# Patient Record
Sex: Male | Born: 1955
Health system: Southern US, Community
[De-identification: ages and names within clinical notes are randomized; demographics above are authoritative.]

## PROBLEM LIST (undated history)

## (undated) DIAGNOSIS — K635 Polyp of colon: Secondary | ICD-10-CM

## (undated) DIAGNOSIS — G4730 Sleep apnea, unspecified: Secondary | ICD-10-CM

## (undated) DIAGNOSIS — L678 Other hair color and hair shaft abnormalities: Secondary | ICD-10-CM

## (undated) DIAGNOSIS — L738 Other specified follicular disorders: Secondary | ICD-10-CM

## (undated) DIAGNOSIS — M1990 Unspecified osteoarthritis, unspecified site: Secondary | ICD-10-CM

## (undated) DIAGNOSIS — K219 Gastro-esophageal reflux disease without esophagitis: Secondary | ICD-10-CM

## (undated) DIAGNOSIS — J449 Chronic obstructive pulmonary disease, unspecified: Secondary | ICD-10-CM

## (undated) DIAGNOSIS — R42 Dizziness and giddiness: Secondary | ICD-10-CM

## (undated) DIAGNOSIS — F329 Major depressive disorder, single episode, unspecified: Secondary | ICD-10-CM

## (undated) DIAGNOSIS — E785 Hyperlipidemia, unspecified: Secondary | ICD-10-CM

## (undated) DIAGNOSIS — S22009A Unspecified fracture of unspecified thoracic vertebra, initial encounter for closed fracture: Secondary | ICD-10-CM

## (undated) DIAGNOSIS — F32A Depression, unspecified: Secondary | ICD-10-CM

## (undated) DIAGNOSIS — J439 Emphysema, unspecified: Secondary | ICD-10-CM

## (undated) DIAGNOSIS — K259 Gastric ulcer, unspecified as acute or chronic, without hemorrhage or perforation: Secondary | ICD-10-CM

## (undated) DIAGNOSIS — F419 Anxiety disorder, unspecified: Secondary | ICD-10-CM

## (undated) HISTORY — DX: Hyperlipidemia, unspecified: E78.5

## (undated) HISTORY — DX: Anxiety disorder, unspecified: F41.9

## (undated) HISTORY — DX: Unspecified fracture of unspecified thoracic vertebra, initial encounter for closed fracture: S22.009A

## (undated) HISTORY — DX: Sleep apnea, unspecified: G47.30

## (undated) HISTORY — DX: Depression, unspecified: F32.A

## (undated) HISTORY — PX: APPENDECTOMY: SHX54

## (undated) HISTORY — PX: CERVICAL FUSION: SHX112

## (undated) HISTORY — DX: Unspecified osteoarthritis, unspecified site: M19.90

## (undated) HISTORY — DX: Emphysema, unspecified: J43.9

## (undated) HISTORY — DX: Other specified follicular disorders: L73.8

## (undated) HISTORY — DX: Gastric ulcer, unspecified as acute or chronic, without hemorrhage or perforation: K25.9

## (undated) HISTORY — DX: Other hair color and hair shaft abnormalities: L67.8

## (undated) HISTORY — DX: Chronic obstructive pulmonary disease, unspecified: J44.9

## (undated) HISTORY — DX: Major depressive disorder, single episode, unspecified: F32.9

## (undated) HISTORY — DX: Gastro-esophageal reflux disease without esophagitis: K21.9

## (undated) HISTORY — DX: Dizziness and giddiness: R42

## (undated) HISTORY — DX: Polyp of colon: K63.5

## (undated) HISTORY — PX: INGUINAL HERNIA REPAIR: SUR1180

---

## 2010-02-06 ENCOUNTER — Emergency Department (HOSPITAL_BASED_OUTPATIENT_CLINIC_OR_DEPARTMENT_OTHER)
Admission: EM | Admit: 2010-02-06 | Discharge: 2010-02-06 | Payer: Self-pay | Source: Home / Self Care | Admitting: Emergency Medicine

## 2010-02-07 ENCOUNTER — Inpatient Hospital Stay (HOSPITAL_COMMUNITY)
Admission: EM | Admit: 2010-02-07 | Discharge: 2010-02-10 | Payer: Self-pay | Attending: Neurosurgery | Admitting: Neurosurgery

## 2011-10-05 ENCOUNTER — Encounter (HOSPITAL_BASED_OUTPATIENT_CLINIC_OR_DEPARTMENT_OTHER): Payer: Self-pay | Admitting: *Deleted

## 2011-10-05 ENCOUNTER — Emergency Department (HOSPITAL_BASED_OUTPATIENT_CLINIC_OR_DEPARTMENT_OTHER)
Admission: EM | Admit: 2011-10-05 | Discharge: 2011-10-05 | Disposition: A | Discharge status: Home / Self Care | Payer: Self-pay | Attending: Emergency Medicine | Admitting: Emergency Medicine

## 2011-10-05 DIAGNOSIS — K219 Gastro-esophageal reflux disease without esophagitis: Secondary | ICD-10-CM | POA: Insufficient documentation

## 2011-10-05 DIAGNOSIS — F17200 Nicotine dependence, unspecified, uncomplicated: Secondary | ICD-10-CM | POA: Insufficient documentation

## 2011-10-05 DIAGNOSIS — K4090 Unilateral inguinal hernia, without obstruction or gangrene, not specified as recurrent: Secondary | ICD-10-CM | POA: Insufficient documentation

## 2011-10-05 HISTORY — DX: Unspecified fracture of unspecified thoracic vertebra, initial encounter for closed fracture: S22.009A

## 2011-10-05 MED ORDER — POLYETHYLENE GLYCOL 3350 17 GM/SCOOP PO POWD: 17.0000 g | Freq: Every day | ORAL | Status: AC

## 2011-10-05 NOTE — ED Notes (Signed)
Patient states he has had left groin pain and swelling for the last 3 days.  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

## 2011-10-05 NOTE — ED Provider Notes (Signed)
History     CSN: [id number]  Arrival date & time 10/05/11  1051   First MD Initiated Contact with Patient 10/05/11 1115      Chief Complaint  Patient presents with  . Groin Swelling    (Consider location/radiation/quality/duration/timing/severity/associated sxs/prior treatment) HPI Patient is a 56 year old male presents today complaining of swelling in the left groin area over the past 3 days. 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 He presents today as he's had continuing pain with this labeled in this area. Patient has no prior history of hernia. He denies any testicular pain or swelling. Patient denies any dysuria or hematuria. Patient denies abdominal pain. He notes he's had recent constipation his last bowel movement was this morning. Patient does note worsening of his pain with straining with bowel movements. He denies any blood in his stools or dark her stools. Patient denies nausea or vomiting as well. There's no radiation the patient's pain. He rates this as a 6/10. This is better with sitting and worse with standing. There no other associated or modifying factors. Past Medical History  Diagnosis Date  . Reflux   . Thoracic spine fracture     Past Surgical History  Procedure Date  . Appendectomy     History reviewed. No pertinent family history.  History  Substance Use Topics  . Smoking status: Current Everyday Smoker    Types: Cigarettes  . Smokeless tobacco: Not on file  . Alcohol Use: No      Review of Systems  Constitutional: Negative.   HENT: Negative.   Eyes: Negative.   Respiratory: Negative.   Cardiovascular: Negative.   Gastrointestinal: Positive for constipation.  Genitourinary:       Left groin pain  Musculoskeletal: Negative.   Skin: Negative.   Neurological: Negative.   Hematological: Negative.   Psychiatric/Behavioral: Negative.   All other systems reviewed and are negative.    Allergies  Niacin  and related  Home Medications   Current Outpatient Rx  Name Route Sig Dispense Refill  . ACETAMINOPHEN 500 MG PO TABS Oral Take 500 mg by mouth every 6 (six) hours as needed.    [name] OMEPRAZOLE 20 MG PO CPDR Oral Take 20 mg by mouth daily.    [name] POLYETHYLENE GLYCOL 3350 PO POWD Oral Take 17 g by mouth daily. 255 g 0    BP 124/80  Pulse 72  Temp 98.5 F (36.9 C) (Oral)  Resp 20  Ht 5\' 4"  (1.626 m)  Wt 143 lb (64.864 kg)  BMI 24.55 kg/m2  SpO2 100%  Physical Exam  Nursing note and vitals reviewed. GEN: Well-developed, well-nourished male in no distress HEENT: Atraumatic, normocephalic.  EYES: PERRLA BL, no scleral icterus. NECK: Trachea midline, no meningismus CV: regular rate and rhythm. No murmurs, rubs, or gallops PULM: No respiratory distress.  No crackles, wheezes, or rales. GI: soft, non-tender. No guarding, rebound, or tenderness. + bowel sounds  GU: Circumcised male with no inguinal lymphadenopathy. Patient noted to have tenderness on left inguinal hernia exam with small hernia appreciated. No hernia appreciated on the right. No epididymal or testicular tenderness. No testicular masses noted. No discharge or other skin lesions appreciated. Neuro: cranial nerves grossly 2-12 intact, no abnormalities of strength or sensation, A and O x 3 MSK: Patient moves all 4 extremities symmetrically, no deformity, edema, or injury noted Skin: No rashes petechiae, purpura, or jaundice Psych: no abnormality of mood   ED Course  Procedures (including critical  care time)  Labs Reviewed - No data to display No results found.   1. Inguinal hernia       MDM  Patient was evaluated by myself. Based on evaluation patient had small left-sided inguinal hernia. History was consistent with symptoms from this. Patient denied any dysuria. He endorsed occasional constipation with worsening of his pain with significant straining with bowel movements. Patient given prescription for MiraLAX.  Patient has home pain medications for his back but did not take these on regular basis. He was instructed to use ice pack as well as supine position help alleviate pain from this in the future. Patient did not have incarcerated hernia today. Patient was referred to Gen. surgery. He was told that if he had increasing swelling in this area but did not resolve with ice and home pain medications that he needed to return immediately for evaluation. Patient was comfortable with plan and was discharged in good condition.        Cyndra Numbers, MD 10/05/11 [phone]

## 2011-10-19 ENCOUNTER — Ambulatory Visit (INDEPENDENT_AMBULATORY_CARE_PROVIDER_SITE_OTHER): Payer: Self-pay | Admitting: General Surgery

## 2011-10-19 ENCOUNTER — Encounter (INDEPENDENT_AMBULATORY_CARE_PROVIDER_SITE_OTHER): Payer: Self-pay | Admitting: General Surgery

## 2011-10-19 VITALS — BP 118/64 | HR 72 | Temp 98.0°F | Resp 16 | Ht 64.0 in | Wt 154.4 lb

## 2011-10-19 DIAGNOSIS — K4090 Unilateral inguinal hernia, without obstruction or gangrene, not specified as recurrent: Secondary | ICD-10-CM

## 2011-10-19 MED ORDER — TAMSULOSIN HCL 0.4 MG PO CAPS: 0.4000 mg | ORAL_CAPSULE | Freq: Every day | ORAL | Status: DC

## 2011-10-19 NOTE — Patient Instructions (Signed)
Pick up your prescription for Flomax and start taking 2 days before surgery

## 2011-10-19 NOTE — Progress Notes (Signed)
Patient ID: Jared Bryan, male   DOB: Aug 07, 1955, 56 y.o.   MRN: [id number]  Chief Complaint  Patient presents with  . Pre-op Exam    eval [facility]    HPI Jared Bryan is a 55 y.o. male.   HPI 56 year old Caucasian male referred by Dr. Alto Denver for evaluation of a left inguinal hernia. The patient states that he awoke about 2 weeks ago on a Saturday morning with pain in his left groin. He also noted some swelling at that time. Throughout the day he developed burning and stinging in his groin area radiating into his inner thigh. He describes it as a shooting pain. He also felt a knot. He went to the emergency room where he was found to have a reducible inguinal hernia. He denies any fever, chills, nausea, vomiting, diarrhea or constipation. He states the swelling has gone down significantly since he was in the emergency room. 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  Past Medical History  Diagnosis Date  . Reflux   . Thoracic spine fracture   . GERD (gastroesophageal reflux disease)   . Hyperlipidemia     Past Surgical History  Procedure Date  . Appendectomy     Family History  Problem Relation Age of Onset  . Heart disease Father   . Cancer Paternal Grandfather     lung    Social History History  Substance Use Topics  . Smoking status: Current Every Day Smoker -- 2.0 packs/day    Types: Cigarettes  . Smokeless tobacco: Former Neurosurgeon  . Alcohol Use: No    Allergies  Allergen Reactions  . Niacin And Related Rash    Current Outpatient Prescriptions  Medication Sig Dispense Refill  . acetaminophen (TYLENOL) 500 MG tablet Take 500 mg by mouth every 6 (six) hours as needed.      [name] HYDROcodone-acetaminophen (NORCO/VICODIN) 5-325 MG per tablet Take 1 tablet by mouth as needed. Pt states taking 1/2 tablet as needed.   Has had rx since Nov. 2012      . omeprazole (PRILOSEC) 20 MG capsule Take 20 mg by mouth daily.      . Tamsulosin  HCl (FLOMAX) 0.4 MG CAPS Take 1 capsule (0.4 mg total) by mouth daily after supper.  7 capsule  0    Review of Systems Review of Systems  Constitutional: Negative for fever, chills, appetite change and unexpected weight change.  HENT: Negative for congestion and trouble swallowing.   Eyes: Negative for visual disturbance.  Respiratory: Positive for cough. Negative for chest tightness and shortness of breath.   Cardiovascular: Negative for chest pain and leg swelling.       No PND, no orthopnea, no DOE  Gastrointestinal: Negative for diarrhea and constipation.       See HPI  Genitourinary: Positive for difficulty urinating. Negative for dysuria and hematuria.       +nocturia, weak stream  Musculoskeletal: Negative.   Skin: Negative for rash.  Neurological: Positive for headaches. Negative for seizures and speech difficulty.       Denies TIA, amaurosis fugax; decreased sensation in legs secondary to back fracture  Hematological: Does not bruise/bleed easily.  Psychiatric/Behavioral: Negative for behavioral problems and confusion.    Blood pressure 118/64, pulse 72, temperature 98 F (36.7 C), temperature source Temporal, resp. rate 16, height 5\' 4"  (1.626 m), weight 154 lb 6.4 oz (70.035 kg).  Physical Exam Physical Exam  Vitals reviewed.  Constitutional: He is oriented to person, place, and time. He appears well-developed and well-nourished. No distress.       Walks with limp; obese  HENT:  Head: Normocephalic and atraumatic.  Right Ear: External ear normal.  Left Ear: External ear normal.  Eyes: Conjunctivae normal are normal. No scleral icterus.  Neck: Normal range of motion. Neck supple. No tracheal deviation present. No thyromegaly present.  Cardiovascular: Normal rate, regular rhythm and normal heart sounds.   Pulmonary/Chest: Effort normal and breath sounds normal. No respiratory distress. He has no wheezes.  Abdominal: Soft. Bowel sounds are normal. He exhibits no  distension. There is no tenderness. A hernia is present. Hernia confirmed positive in the left inguinal area. Hernia confirmed negative in the right inguinal area.  Genitourinary: Testes normal and penis normal.     Musculoskeletal: He exhibits no edema and no tenderness.  Neurological: He is alert and oriented to person, place, and time.  Skin: Skin is warm and dry. He is not diaphoretic.  Psychiatric: He has a normal mood and affect. His behavior is normal. Judgment and thought content normal.    Data Reviewed [facility] ED note from 10/05/11  Assessment    Left inguinal hernia    Plan    We discussed the etiology of inguinal hernias. We discussed the signs & symptoms of incarceration & strangulation.  We discussed non-operative and operative management.  The patient has elected to proceed with OPEN REPAIR OF LEFT INGUINAL HERNIA WITH MESH   I described the procedure in detail.  The patient was given educational material. We discussed the risks and benefits including but not limited to bleeding, infection, chronic inguinal pain, nerve entrapment, hernia recurrence, mesh complications, hematoma formation, urinary retention, injury to the testicles, numbness in the groin, blood clots, injury to the surrounding structures, and anesthesia risk. We also discussed the typical post operative recovery course, including no heavy lifting for 4-6 weeks. I explained that the likelihood of improvement of their symptoms is good. I explained to the patient that he is at higher risk for infection and recurrence given his smoking. We will place him on perioperative Flomax to decrease his risk of urinary retention.   Mary Sella. Andrey Campanile, MD, FACS General, Bariatric, & Minimally Invasive Surgery [facility], [place]         [facility] M 10/19/2011, 3:26 PM

## 2011-10-26 ENCOUNTER — Ambulatory Visit
Admission: RE | Admit: 2011-10-26 | Discharge: 2011-10-26 | Disposition: A | Discharge status: Home / Self Care | Payer: No Typology Code available for payment source | Source: Ambulatory Visit | Attending: General Surgery | Admitting: General Surgery

## 2011-10-26 ENCOUNTER — Other Ambulatory Visit (INDEPENDENT_AMBULATORY_CARE_PROVIDER_SITE_OTHER): Payer: Self-pay | Admitting: General Surgery

## 2011-10-26 DIAGNOSIS — Z01811 Encounter for preprocedural respiratory examination: Secondary | ICD-10-CM

## 2011-10-28 DIAGNOSIS — K4090 Unilateral inguinal hernia, without obstruction or gangrene, not specified as recurrent: Secondary | ICD-10-CM

## 2011-11-04 ENCOUNTER — Other Ambulatory Visit (INDEPENDENT_AMBULATORY_CARE_PROVIDER_SITE_OTHER): Payer: Self-pay | Admitting: General Surgery

## 2011-11-04 NOTE — Telephone Encounter (Signed)
Hydrocodone 5/325 #30 with no refill per protocol called to [facility] pharmacy.

## 2011-11-09 ENCOUNTER — Ambulatory Visit (INDEPENDENT_AMBULATORY_CARE_PROVIDER_SITE_OTHER): Payer: Self-pay | Admitting: General Surgery

## 2011-11-09 ENCOUNTER — Encounter (INDEPENDENT_AMBULATORY_CARE_PROVIDER_SITE_OTHER): Payer: Self-pay | Admitting: General Surgery

## 2011-11-09 VITALS — BP 142/80 | HR 58 | Temp 98.0°F | Resp 18 | Ht 64.0 in | Wt 156.4 lb

## 2011-11-09 DIAGNOSIS — Z09 Encounter for follow-up examination after completed treatment for conditions other than malignant neoplasm: Secondary | ICD-10-CM

## 2011-11-09 MED ORDER — TAMSULOSIN HCL 0.4 MG PO CAPS
0.4000 mg | ORAL_CAPSULE | Freq: Every day | ORAL | Status: DC
Start: 2011-11-09 — End: 2012-10-09

## 2011-11-09 NOTE — Progress Notes (Signed)
Subjective:     Patient ID: Jared Bryan, male   DOB: 06-30-1955, 56 y.o.   MRN: [id number]  HPI  56 year old Caucasian male comes in for followup after undergoing an open repair of a left direct inguinal hernia with mesh on September 26 at the surgical [facility]. He states that he is doing well. He denies any stinging or burning like he had preoperatively. He is down to about half a pain pill twice a day. He denies any fevers or chills. He denies any nausea or vomiting. He is moving his bowels regularly. He states that his incision itself is still sore. He states that he did develop swelling after surgery but it has resolved. The thing that bothers him most is his urinary issues. He states that he had urinary issues before surgery; however it appears that they have worsened since surgery. It mainly bothers him at night and in the early morning. He states that he is just mainly dribbling urine. He doesn't feel as if he completely empties his bladder. He denies any pain with urination. Review of Systems     Objective:   Physical Exam BP 142/80  Pulse 58  Temp 98 F (36.7 C) (Temporal)  Resp 18  Ht 5\' 4"  (1.626 m)  Wt 156 lb 6.4 oz (70.943 kg)  BMI 26.85 kg/m2 Alert, no apparent distress Abdomen-soft, obese, nontender GU-well-healed left groin incision without any cellulitis, induration, or fluctuance. No inguinal swelling. Both testicles are descended and symmetric. No bruising. No signs of inguinal hernia recurrence    Assessment:     Status post open repair of left direct inguinal hernia with mesh    Plan:     Overall he appears to be doing well with respect to his hernia surgery. I reminded him he should not do any heavy lifting until the week of November 11. We are going to place him back on Flomax. I told him if he still having urinary issues, we'll need to refer him to urology. My suspicion for urinary tract infection is low.  Mary Sella. Andrey Campanile, MD, FACS General, Bariatric, &  Minimally Invasive Surgery [facility], [place]

## 2011-11-09 NOTE — Patient Instructions (Signed)
Pick up your prescription at your pharmacy You can resume full activities the week of Nov 11 Call if your urination doesn't improve

## 2012-09-14 ENCOUNTER — Encounter: Payer: Self-pay | Admitting: Gastroenterology

## 2012-09-22 ENCOUNTER — Ambulatory Visit: Payer: Self-pay | Admitting: Gastroenterology

## 2012-10-05 ENCOUNTER — Encounter: Payer: Self-pay | Admitting: *Deleted

## 2012-10-05 ENCOUNTER — Encounter: Payer: Self-pay | Admitting: Cardiovascular Disease

## 2012-10-05 DIAGNOSIS — IMO0001 Reserved for inherently not codable concepts without codable children: Secondary | ICD-10-CM | POA: Insufficient documentation

## 2012-10-05 DIAGNOSIS — L738 Other specified follicular disorders: Secondary | ICD-10-CM | POA: Insufficient documentation

## 2012-10-05 DIAGNOSIS — R42 Dizziness and giddiness: Secondary | ICD-10-CM | POA: Insufficient documentation

## 2012-10-05 DIAGNOSIS — K219 Gastro-esophageal reflux disease without esophagitis: Secondary | ICD-10-CM | POA: Insufficient documentation

## 2012-10-05 DIAGNOSIS — E785 Hyperlipidemia, unspecified: Secondary | ICD-10-CM | POA: Insufficient documentation

## 2012-10-05 DIAGNOSIS — S22009A Unspecified fracture of unspecified thoracic vertebra, initial encounter for closed fracture: Secondary | ICD-10-CM | POA: Insufficient documentation

## 2012-10-09 ENCOUNTER — Ambulatory Visit (INDEPENDENT_AMBULATORY_CARE_PROVIDER_SITE_OTHER): Payer: BC Managed Care – PPO | Admitting: Cardiovascular Disease

## 2012-10-09 ENCOUNTER — Ambulatory Visit (INDEPENDENT_AMBULATORY_CARE_PROVIDER_SITE_OTHER)
Admission: RE | Admit: 2012-10-09 | Discharge: 2012-10-09 | Disposition: A | Discharge status: Home / Self Care | Payer: BC Managed Care – PPO | Source: Ambulatory Visit | Attending: Cardiovascular Disease | Admitting: Cardiovascular Disease

## 2012-10-09 ENCOUNTER — Encounter: Payer: Self-pay | Admitting: Cardiovascular Disease

## 2012-10-09 VITALS — BP 150/98 | HR 69 | Ht 64.0 in | Wt 163.0 lb

## 2012-10-09 DIAGNOSIS — F17200 Nicotine dependence, unspecified, uncomplicated: Secondary | ICD-10-CM

## 2012-10-09 DIAGNOSIS — I209 Angina pectoris, unspecified: Secondary | ICD-10-CM | POA: Insufficient documentation

## 2012-10-09 DIAGNOSIS — R079 Chest pain, unspecified: Secondary | ICD-10-CM | POA: Insufficient documentation

## 2012-10-09 DIAGNOSIS — IMO0001 Reserved for inherently not codable concepts without codable children: Secondary | ICD-10-CM | POA: Insufficient documentation

## 2012-10-09 NOTE — Assessment & Plan Note (Signed)
F/U Dr Tomasa Blase for lab work Not clear that he will take statin if needed

## 2012-10-09 NOTE — Progress Notes (Signed)
Patient ID: Jared Bryan, male   DOB: 03-26-1955, 57 y.o.   MRN: [id number] 57 yo referred by Dr Tomasa Blase for chest pain.  He is a smoker with family history of CAD.  Poor medical f/u due to previous lack of insurance.  Recent dysphagia started on Dexilant and paxil 09/20/12  No motivation to quit smoking Hasn't tried in 20 years.  No CXR this year. Mild cough.  He seems to have anger issues and does not like to see doctors.  SSCP seen in ER [facility] 9 years ago R/O and refused to be admitted.  Evaluated by Dr Clovis Riley 2 years ago for chest pain and again refused to have nuclear study.  His SSCP is muscular sounding.  Some pain on twisting  Intermitant and activity more limited by dyspnea.  Clinical COPD.  Still works as Designer, fashion/clothing.  Feels acid blocker has helped his dysphagia but has not seen GI doctor yet  ROS: Denies fever, malais, weight loss, blurry vision, decreased visual acuity, cough, sputum, SOB, hemoptysis, pleuritic pain, palpitaitons, heartburn, abdominal pain, melena, lower extremity edema, claudication, or rash.  All other systems reviewed and negative   General: Affect appropriate Chronically ill male with nicotine on breath HEENT: normal Neck supple with no adenopathy JVP normal no bruits no thyromegaly Lungs clear with no wheezing and good diaphragmatic motion Heart:  S1/S2 no murmur,rub, gallop or click PMI normal Abdomen: benighn, BS positve, no tenderness, no AAA no bruit.  No HSM or HJR Distal pulses intact with no bruits No edema Neuro non-focal Skin warm and dry No muscular weakness  Medications Current Outpatient Prescriptions  Medication Sig Dispense Refill  . acetaminophen (TYLENOL) 500 MG tablet Take 500 mg by mouth every 6 (six) hours as needed.      [name] HYDROcodone-acetaminophen (NORCO/VICODIN) 5-325 MG per tablet TAKE ONE TO TWO TABLETS BY MOUTH EVERY 4 HOURS AS NEEDED FOR PAIN  30 tablet  0  . omeprazole (PRILOSEC) 20 MG capsule Take 20 mg by mouth daily.      [name]  PARoxetine (PAXIL) 20 MG tablet Take 20 mg by mouth every morning.       No current facility-administered medications for this visit.    Allergies Niacin and related  Family History: Family History  Problem Relation Age of Onset  . Heart disease Father   . Cancer Paternal Grandfather     lung  . Hypertension Father   . Hypercholesterolemia Father   . Diabetes Mother     Social History: History   Social History  . Marital Status: Married    Spouse Name: N/A    Number of Children: N/A  . Years of Education: N/A   Occupational History  . Not on file.   Social History Main Topics  . Smoking status: Current Every Day Smoker -- 2.00 packs/day    Types: Cigarettes  . Smokeless tobacco: Former Neurosurgeon  . Alcohol Use: No  . Drug Use: No  . Sexual Activity: Not on file   Other Topics Concern  . Not on file   Social History Narrative  . No narrative on file    Electrocardiogram:8/20  [facility] office NSR normal ECG    Assessment and Plan

## 2012-10-09 NOTE — Assessment & Plan Note (Signed)
Improved with omeprazole  F/U GI  Should probably have EGD

## 2012-10-09 NOTE — Assessment & Plan Note (Signed)
Atypical Multiple risk factors Normal ECG  F/U ETT  Does not need nuclear study as first test given normal ECG He is willing to have a heart cath if stress test is abnormal

## 2012-10-09 NOTE — Patient Instructions (Addendum)
Your physician wants you to follow-up in: YEAR WITH DR NISHAN  You will receive a reminder letter in the mail two months in advance. If you don't receive a letter, please call our office to schedule the follow-up appointment.  Your physician recommends that you continue on your current medications as directed. Please refer to the Current Medication list given to you today.  A chest x-ray takes a picture of the organs and structures inside the chest, including the heart, lungs, and blood vessels. This test can show several things, including, whether the heart is enlarges; whether fluid is building up in the lungs; and whether pacemaker / defibrillator leads are still in place.  Your physician has requested that you have an exercise tolerance test. For further information please visit [email or web address]. Please also follow instruction sheet, as given.   

## 2012-10-09 NOTE — Assessment & Plan Note (Signed)
No motivation to quit Will order CXR since he has not had one in a year and has exertional dsypnea

## 2012-10-12 ENCOUNTER — Telehealth: Payer: Self-pay | Admitting: Cardiovascular Disease

## 2012-10-12 NOTE — Telephone Encounter (Signed)
Spoke with patient's wife. Relayed results of CXR. She stated that she will continue to work with him to stop smoking. She verbalized understanding and appreciation for the call back.

## 2012-10-12 NOTE — Telephone Encounter (Signed)
New message:  Pt was called with results of CXR but was on the job,  Wife would like to get the results as well to make sure he understood everything.  Please call her.

## 2012-10-13 ENCOUNTER — Encounter: Payer: Self-pay | Admitting: Gastroenterology

## 2012-10-20 ENCOUNTER — Encounter: Payer: Self-pay | Admitting: Cardiovascular Disease

## 2012-10-31 ENCOUNTER — Ambulatory Visit: Payer: Self-pay | Admitting: Cardiovascular Disease

## 2012-11-01 ENCOUNTER — Ambulatory Visit: Payer: Self-pay | Admitting: Gastroenterology

## 2012-11-06 ENCOUNTER — Ambulatory Visit (INDEPENDENT_AMBULATORY_CARE_PROVIDER_SITE_OTHER): Payer: BC Managed Care – PPO | Admitting: Nurse Practitioner

## 2012-11-06 ENCOUNTER — Encounter: Payer: Self-pay | Admitting: Nurse Practitioner

## 2012-11-06 VITALS — BP 140/93 | HR 70 | Resp 20

## 2012-11-06 DIAGNOSIS — R079 Chest pain, unspecified: Secondary | ICD-10-CM

## 2012-11-06 NOTE — Progress Notes (Signed)
Exercise Treadmill Test  Pre-Exercise Testing Evaluation Rhythm: normal sinus  Rate: 70     Test  Exercise Tolerance Test Ordering MD: Charlton Haws, MD  Interpreting MD: Norma Fredrickson NP  Unique Test No: 1  Treadmill:  1  Indication for ETT: chest pain - rule out ischemia  Contraindication to ETT: No   Stress Modality: exercise - treadmill  Cardiac Imaging Performed: non   Protocol: standard Bruce - maximal  Max BP:  198/80  Max MPHR (bpm):  161 85% MPR (bpm):  137  MPHR obtained (bpm):  153 % MPHR obtained:  95%  Reached 85% MPHR (min:sec):  8:08 Total Exercise Time (min-sec):  9:30  Workload in METS: 10.9 Borg Scale: 15  Reason ETT Terminated:  patient's desire to stop    ST Segment Analysis At Rest: normal ST segments - no evidence of significant ST depression With Exercise: no evidence of significant ST depression  Other Information Arrhythmia:  No Angina during ETT:  absent (0) Quality of ETT:  diagnostic  ETT Interpretation:  normal - no evidence of ischemia by ST analysis  Comments: Patient presents today for routine GXT - has had atypical chest pain - improved with PPI therapy per his report to me. Ongoing tobacco use - not interested in stopping.   Today, he exercised on the standard Bruce protocol for a total of 9:30. Good exercise tolerance. Adequate blood pressure response. Clinically negative. EKG negative for ischemia.   Recommendations: CV risk factor modification.   Smoking cessation - not interested in stopping at this time.  See back per Dr. Fabio Bering plan in one year.  Patient is agreeable to this plan and will call if any problems develop in the interim.   Rosalio Macadamia, RN, ANP-C [facility] [address] [place], [place]  [address]

## 2012-11-13 ENCOUNTER — Telehealth: Payer: Self-pay | Admitting: Cardiovascular Disease

## 2012-11-13 NOTE — Telephone Encounter (Signed)
PT'S WIFE  AWARE  OF  GXT RESULTS ./CY 

## 2012-11-13 NOTE — Telephone Encounter (Signed)
New message ° ° ° °Stress test results °

## 2013-10-04 ENCOUNTER — Encounter: Payer: Self-pay | Admitting: *Deleted

## 2013-10-04 ENCOUNTER — Ambulatory Visit (INDEPENDENT_AMBULATORY_CARE_PROVIDER_SITE_OTHER)
Admission: RE | Admit: 2013-10-04 | Discharge: 2013-10-04 | Disposition: A | Discharge status: Home / Self Care | Payer: BC Managed Care – PPO | Source: Ambulatory Visit | Attending: Cardiovascular Disease | Admitting: Cardiovascular Disease

## 2013-10-04 ENCOUNTER — Ambulatory Visit (INDEPENDENT_AMBULATORY_CARE_PROVIDER_SITE_OTHER): Payer: BC Managed Care – PPO | Admitting: Cardiovascular Disease

## 2013-10-04 ENCOUNTER — Encounter: Payer: Self-pay | Admitting: Cardiovascular Disease

## 2013-10-04 VITALS — BP 115/70 | HR 57 | Ht 64.0 in | Wt 171.2 lb

## 2013-10-04 DIAGNOSIS — E785 Hyperlipidemia, unspecified: Secondary | ICD-10-CM

## 2013-10-04 DIAGNOSIS — F17200 Nicotine dependence, unspecified, uncomplicated: Secondary | ICD-10-CM

## 2013-10-04 DIAGNOSIS — R079 Chest pain, unspecified: Secondary | ICD-10-CM

## 2013-10-04 LAB — CBC WITH DIFFERENTIAL/PLATELET
BASOS PCT: 0.1 % (ref 0.0–3.0)
Basophils Absolute: 0 10*3/uL (ref 0.0–0.1)
EOS ABS: 0.1 10*3/uL (ref 0.0–0.7)
Eosinophils Relative: 1.3 % (ref 0.0–5.0)
HEMATOCRIT: 40 % (ref 39.0–52.0)
HEMOGLOBIN: 13.6 g/dL (ref 13.0–17.0)
LYMPHS ABS: 2 10*3/uL (ref 0.7–4.0)
LYMPHS PCT: 27.6 % (ref 12.0–46.0)
MCHC: 34 g/dL (ref 30.0–36.0)
MCV: 91.8 fl (ref 78.0–100.0)
Monocytes Absolute: 0.5 10*3/uL (ref 0.1–1.0)
Monocytes Relative: 6.9 % (ref 3.0–12.0)
NEUTROS ABS: 4.7 10*3/uL (ref 1.4–7.7)
Neutrophils Relative %: 64.1 % (ref 43.0–77.0)
Platelets: 267 10*3/uL (ref 150.0–400.0)
RBC: 4.36 Mil/uL (ref 4.22–5.81)
RDW: 14.5 % (ref 11.5–15.5)
WBC: 7.3 10*3/uL (ref 4.0–10.5)

## 2013-10-04 LAB — PROTIME-INR
INR: 1 ratio (ref 0.8–1.0)
Prothrombin Time: 11.1 s (ref 9.6–13.1)

## 2013-10-04 LAB — BASIC METABOLIC PANEL
BUN: 17 mg/dL (ref 6–23)
CALCIUM: 9.3 mg/dL (ref 8.4–10.5)
CHLORIDE: 105 meq/L (ref 96–112)
CO2: 27 meq/L (ref 19–32)
CREATININE: 0.9 mg/dL (ref 0.4–1.5)
GFR: 89.08 mL/min (ref >60.00)
Glucose, Bld: 91 mg/dL (ref 70–99)
Potassium: 3.8 mEq/L (ref 3.5–5.1)
Sodium: 138 mEq/L (ref 135–145)

## 2013-10-04 MED ORDER — NITROGLYCERIN 0.4 MG SL SUBL: 0.4000 mg | SUBLINGUAL_TABLET | SUBLINGUAL | Status: DC | PRN

## 2013-10-04 NOTE — Assessment & Plan Note (Signed)
Recurrent despite normal ETT and recent normal stress echo.  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  Risks discussed and willing to proceed.  Orders written and lab called Scheduled for 9/10.  Will have labs and CXR today Nitro called in On beta blocker  And ASA

## 2013-10-04 NOTE — Progress Notes (Signed)
Patient ID: Jared Bryan, male   DOB: 11/02/1955, 58 y.o.   MRN: [id number] 58 yo  smoker with family history of CAD. Poor medical f/u due to previous lack of insurance. Dysphagia started on Dexilant and paxil 09/20/12 No motivation to quit smoking Hasn't tried in 20 years. No CXR this year. Mild cough. He seems to have anger issues and does not like to see doctors. SSCP seen in ER [place] 9 years ago R/O and refused to be admitted. Evaluated by Dr Mitchell 2 years ago for chest pain and again refused to have nuclear study. His SSCP is muscular sounding. Some pain on twisting Intermitant and activity more limited by dyspnea. Clinical COPD. Still works as roofer. Feels acid blocker has helped his dysphagia but has not seen GI doctor yet  11/06/12 had normal ETT   Since then continues to have SSCP  Remains atypical sharp but not pleuritic  Seen in [place] by [facility] and ndicates normal stress echo 3 months ago Despite two normal stress tests he contnues to have chest pain with multiple risk factors.  Started on labatolol by Dr Schultz this week    ROS: Denies fever, malais, weight loss, blurry vision, decreased visual acuity, cough, sputum, SOB, hemoptysis, pleuritic pain, palpitaitons, heartburn, abdominal pain, melena, lower extremity edema, claudication, or rash.  All other systems reviewed and negative  General: Affect appropriate Chronically ill male  HEENT: normal Neck supple with no adenopathy JVP normal no bruits no thyromegaly Lungs COPD no wheezing and good diaphragmatic motion Heart:  S1/S2 no murmur, no rub, gallop or click PMI normal Abdomen: benighn, BS positve, no tenderness, no AAA no bruit.  No HSM or HJR Distal pulses intact with no bruits No edema Neuro non-focal Skin warm and dry No muscular weakness   Current Outpatient Prescriptions  Medication Sig Dispense Refill  . acetaminophen (TYLENOL) 500 MG tablet Take 500 mg by mouth every 6 (six) hours as needed.      .  HYDROcodone-acetaminophen (NORCO/VICODIN) 5-325 MG per tablet TAKE ONE TO TWO TABLETS BY MOUTH EVERY 4 HOURS AS NEEDED FOR PAIN  30 tablet  0  . labetalol (NORMODYNE) 200 MG tablet       . lisinopril (PRINIVIL,ZESTRIL) 40 MG tablet       . mupirocin ointment (BACTROBAN) 2 %       . PARoxetine (PAXIL) 20 MG tablet Take 20 mg by mouth every morning.       No current facility-administered medications for this visit.    Allergies  Niacin and related  Electrocardiogram:  SR rate 57 normal   Assessment and Plan   

## 2013-10-04 NOTE — Assessment & Plan Note (Signed)
Counseled on cessation no motivation to quit  CXR today pre cath

## 2013-10-04 NOTE — Patient Instructions (Signed)
Your physician has requested that you have a cardiac catheterization. Cardiac catheterization is used to diagnose and/or treat various heart conditions. Doctors may recommend this procedure for a number of different reasons. The most common reason is to evaluate chest pain. Chest pain can be a symptom of coronary artery disease (CAD), and cardiac catheterization can show whether plaque is narrowing or blocking your heart's arteries. This procedure is also used to evaluate the valves, as well as measure the blood flow and oxygen levels in different parts of your heart. For further information please visit [email or web address] Please follow instruction sheet, as given.   Your physician recommends that you return for lab work in:  TODAY   BMET  CBC   INR  A chest x-ray takes a picture of the organs and structures inside the chest, including the heart, lungs, and blood vessels. This test can show several things, including, whether the heart is enlarges; whether fluid is building up in the lungs; and whether pacemaker / defibrillator leads are still in place.

## 2013-10-04 NOTE — Assessment & Plan Note (Signed)
Cholesterol is at goal.  Continue current dose of statin and diet Rx.  No myalgias or side effects.  F/U  LFT's in 6 months. No results found for this basename: [facility]  Labs with Dr Tomasa Blase

## 2013-10-05 ENCOUNTER — Encounter (HOSPITAL_COMMUNITY): Payer: Self-pay | Admitting: Pharmacy Technician

## 2013-10-10 ENCOUNTER — Other Ambulatory Visit: Payer: Self-pay | Admitting: Cardiovascular Disease

## 2013-10-10 DIAGNOSIS — R079 Chest pain, unspecified: Secondary | ICD-10-CM

## 2013-10-11 ENCOUNTER — Encounter (HOSPITAL_COMMUNITY)
Admission: RE | Disposition: A | Discharge status: Home / Self Care | Payer: Self-pay | Source: Ambulatory Visit | Attending: Cardiovascular Disease

## 2013-10-11 ENCOUNTER — Ambulatory Visit (HOSPITAL_COMMUNITY)
Admission: RE | Admit: 2013-10-11 | Discharge: 2013-10-11 | Disposition: A | Discharge status: Home / Self Care | Payer: BC Managed Care – PPO | Source: Ambulatory Visit | Attending: Cardiovascular Disease | Admitting: Cardiovascular Disease

## 2013-10-11 DIAGNOSIS — J4489 Other specified chronic obstructive pulmonary disease: Secondary | ICD-10-CM | POA: Insufficient documentation

## 2013-10-11 DIAGNOSIS — R079 Chest pain, unspecified: Secondary | ICD-10-CM | POA: Diagnosis present

## 2013-10-11 DIAGNOSIS — R1310 Dysphagia, unspecified: Secondary | ICD-10-CM | POA: Insufficient documentation

## 2013-10-11 DIAGNOSIS — F17200 Nicotine dependence, unspecified, uncomplicated: Secondary | ICD-10-CM | POA: Insufficient documentation

## 2013-10-11 DIAGNOSIS — J449 Chronic obstructive pulmonary disease, unspecified: Secondary | ICD-10-CM | POA: Insufficient documentation

## 2013-10-11 DIAGNOSIS — Z8249 Family history of ischemic heart disease and other diseases of the circulatory system: Secondary | ICD-10-CM | POA: Insufficient documentation

## 2013-10-11 DIAGNOSIS — I2510 Atherosclerotic heart disease of native coronary artery without angina pectoris: Secondary | ICD-10-CM

## 2013-10-11 HISTORY — PX: LEFT HEART CATHETERIZATION WITH CORONARY ANGIOGRAM: SHX5451

## 2013-10-11 SURGERY — LEFT HEART CATHETERIZATION WITH CORONARY ANGIOGRAM
Anesthesia: LOCAL

## 2013-10-11 MED ORDER — MIDAZOLAM HCL 2 MG/2ML IJ SOLN
INTRAMUSCULAR | Status: AC
  Filled 2013-10-11: qty 2

## 2013-10-11 MED ORDER — ASPIRIN 81 MG PO CHEW: 81.0000 mg | CHEWABLE_TABLET | ORAL | Status: DC

## 2013-10-11 MED ORDER — HEPARIN (PORCINE) IN NACL 2-0.9 UNIT/ML-% IJ SOLN
INTRAMUSCULAR | Status: AC
  Filled 2013-10-11: qty 1000

## 2013-10-11 MED ORDER — ASPIRIN 81 MG PO CHEW
CHEWABLE_TABLET | ORAL | Status: AC
  Administered 2013-10-11: 81 mg via ORAL
  Filled 2013-10-11: qty 1

## 2013-10-11 MED ORDER — SODIUM CHLORIDE 0.9 % IJ SOLN: 3.0000 mL | Freq: Two times a day (BID) | INTRAMUSCULAR | Status: DC

## 2013-10-11 MED ORDER — NITROGLYCERIN 1 MG/10 ML FOR IR/CATH LAB
INTRA_ARTERIAL | Status: AC
  Filled 2013-10-11: qty 10

## 2013-10-11 MED ORDER — DIAZEPAM 2 MG PO TABS: 2.0000 mg | ORAL_TABLET | Freq: Four times a day (QID) | ORAL | Status: DC | PRN

## 2013-10-11 MED ORDER — HEPARIN SODIUM (PORCINE) 1000 UNIT/ML IJ SOLN
INTRAMUSCULAR | Status: AC
  Filled 2013-10-11: qty 1

## 2013-10-11 MED ORDER — FENTANYL CITRATE 0.05 MG/ML IJ SOLN
INTRAMUSCULAR | Status: AC
  Filled 2013-10-11: qty 2

## 2013-10-11 MED ORDER — SODIUM CHLORIDE 0.45 % IV SOLN: INTRAVENOUS | Status: AC

## 2013-10-11 MED ORDER — VERAPAMIL HCL 2.5 MG/ML IV SOLN
INTRAVENOUS | Status: AC
  Filled 2013-10-11: qty 2

## 2013-10-11 MED ORDER — LIDOCAINE HCL (PF) 1 % IJ SOLN
INTRAMUSCULAR | Status: AC
  Filled 2013-10-11: qty 30

## 2013-10-11 MED ORDER — OXYCODONE-ACETAMINOPHEN 5-325 MG PO TABS: 1.0000 | ORAL_TABLET | ORAL | Status: DC | PRN

## 2013-10-11 MED ORDER — SODIUM CHLORIDE 0.9 % IJ SOLN: 3.0000 mL | INTRAMUSCULAR | Status: DC | PRN

## 2013-10-11 MED ORDER — SODIUM CHLORIDE 0.9 % IV SOLN
250.0000 mL | INTRAVENOUS | Status: DC | PRN
  Administered 2013-10-11: 10:00:00 via INTRAVENOUS

## 2013-10-11 NOTE — Interval H&P Note (Signed)
History and Physical Interval Note:  10/11/2013 9:36 AM  Jared Bryan  has presented today for surgery, with the diagnosis of cp  The various methods of treatment have been discussed with the patient and family. After consideration of risks, benefits and other options for treatment, the patient has consented to  Procedure(s): LEFT HEART CATHETERIZATION WITH CORONARY ANGIOGRAM (N/A) as a surgical intervention .  The patient's history has been reviewed, patient examined, no change in status, stable for surgery.  I have reviewed the patient's chart and labs.  Questions were answered to the patient's satisfaction.     Charlton Haws

## 2013-10-11 NOTE — Progress Notes (Signed)
Report received from [facility].N. Pt care taken over at this time

## 2013-10-11 NOTE — Discharge Instructions (Addendum)
Radial Site Care Refer to this sheet in the next few weeks. These instructions provide you with information on caring for yourself after your procedure. Your caregiver may also give you more specific instructions. Your treatment has been planned according to current medical practices, but problems sometimes occur. Call your caregiver if you have any problems or questions after your procedure. HOME CARE INSTRUCTIONS  You may shower the day after the procedure.Remove the bandage (dressing) and gently wash the site with plain soap and water.Gently pat the site dry.  Do not apply powder or lotion to the site.  Do not submerge the affected site in water for 3 to 5 days.  Inspect the site at least twice daily.  Do not flex or bend the affected arm for 24 hours.  No lifting over 5 pounds (2.3 kg) for 5 days after your procedure.  Do not drive home if you are discharged the same day of the procedure. Have someone else drive you.  You may drive 24 hours after the procedure unless otherwise instructed by your caregiver.  Do not operate machinery or power tools for 24 hours.  A responsible adult should be with you for the first 24 hours after you arrive home. What to expect:  Any bruising will usually fade within 1 to 2 weeks.  Blood that collects in the tissue (hematoma) may be painful to the touch. It should usually decrease in size and tenderness within 1 to 2 weeks. SEEK IMMEDIATE MEDICAL CARE IF:  You have unusual pain at the radial site.  You have redness, warmth, swelling, or pain at the radial site.  You have drainage (other than a small amount of blood on the dressing).  You have chills.  You have a fever or persistent symptoms for more than 72 hours.  You have a fever and your symptoms suddenly get worse.  Your arm becomes pale, cool, tingly, or numb.  You have heavy bleeding from the site. Hold pressure on the site. Document Released: 02/20/2010 Document Revised:  04/12/2011 Document Reviewed: 02/20/2010 [facility] Patient Information 2015 [place], [place]. This information is not intended to replace advice given to you by your health care provider. Make sure you discuss any questions you have with your health care provider.   No driving for 24 hours Call office if cath sight hurts F/U Dr Eden Emms office will call

## 2013-10-11 NOTE — CV Procedure (Signed)
    Cardiac Catheterization Procedure Note  Name: Jared Bryan MRN: [id number] DOB: 1955-09-25  Procedure: Left Heart Cath, Selective Coronary Angiography, LV angiography  Indication:  Chest Pain   Procedural Details: The right wrist was prepped, draped, and anesthetized with 1% lidocaine. Using the modified Seldinger technique, a 5/6 French Slender sheath was introduced into the right radial artery. 3 mg of verapamil was administered through the sheath, weight-based unfractionated heparin was administered intravenously. Standard Judkins catheters were used for selective coronary angiography and left ventriculography. Catheter exchanges were performed over an exchange length guidewire. There were no immediate procedural complications. A TR band was used for radial hemostasis at the completion of the procedure.  The patient was transferred to the post catheterization recovery area for further monitoring.  Procedural Findings: Hemodynamics: AO  104/65 LV  100/11  Coronary angiography: Coronary dominance: right  Left mainstem: Normal  Left anterior descending (LAD):  Normal small tapering distal vessel   D1: small normal  D2 30-40% ostial  D3: small normal  Left circumflex (LCx):  Normal Primarily single OM large and normal  Right coronary artery (RCA):  Super dominant Initially ostial spasm relieved with nitro  Normal Normal PDA/PLA    Left ventriculography: Left ventricular systolic function is normal, LVEF is estimated at 55-65%, there is no significant mitral regurgitation     Recommendations:  No critical epicardial disease.  Needs to stop smoking Continue medical Rx  Charlton Haws MD, [facility] 10/11/2013, 11:08 AM

## 2013-10-11 NOTE — H&P (View-Only) (Signed)
Patient ID: Jared Bryan, male   DOB: [birth year], 58 y.o.   MRN: [id number] 58 y.o.  smoker with family history of CAD. Poor medical f/u due to previous lack of insurance. Dysphagia started on Dexilant and paxil 8/60/14 No motivation to quit smoking Hasn't tried in 20 years. No CXR this year. Mild cough. He seems to have anger issues and does not like to see doctors. SSCP seen in [facility] 9 years ago R/O and refused to be admitted. Evaluated by Dr Clovis Riley 2 years ago for chest pain and again refused to have nuclear study. His SSCP is muscular sounding. Some pain on twisting Intermitant and activity more limited by dyspnea. Clinical COPD. Still works as Designer, fashion/clothing. Feels acid blocker has helped his dysphagia but has not seen GI doctor yet  11/07/58 had normal ETT   Since then continues to have SSCP  Remains atypical sharp but not pleuritic  Seen in [place] by [facility]kar and ndicates normal stress echo 60 years ago Despite two normal stress tests he contnues to have chest pain with multiple risk factors.  Started on labatolol by Dr Tomasa Blase this week    ROS: Denies fever, malais, weight loss, blurry vision, decreased visual acuity, cough, sputum, SOB, hemoptysis, pleuritic pain, palpitaitons, heartburn, abdominal pain, melena, lower extremity edema, claudication, or rash.  All other systems reviewed and negative  General: Affect appropriate Chronically ill male  HEENT: normal Neck supple with no adenopathy JVP normal no bruits no thyromegaly Lungs COPD no wheezing and good diaphragmatic motion Heart:  S1/S2 no murmur, no rub, gallop or click PMI normal Abdomen: benighn, BS positve, no tenderness, no AAA no bruit.  No HSM or HJR Distal pulses intact with no bruits No edema Neuro non-focal Skin warm and dry No muscular weakness   Current Outpatient Prescriptions  Medication Sig Dispense Refill  . acetaminophen (TYLENOL) 500 MG tablet Take 500 mg by mouth every 6 (six) hours as needed.      [name]  HYDROcodone-acetaminophen (NORCO/VICODIN) 5-325 MG per tablet TAKE ONE TO TWO TABLETS BY MOUTH EVERY 4 HOURS AS NEEDED FOR PAIN  30 tablet  0  . labetalol (NORMODYNE) 200 MG tablet       . lisinopril (PRINIVIL,ZESTRIL) 40 MG tablet       . mupirocin ointment (BACTROBAN) 2 %       . PARoxetine (PAXIL) 20 MG tablet Take 20 mg by mouth every morning.       No current facility-administered medications for this visit.    Allergies  Niacin and related  Electrocardiogram:  SR rate 57 normal   Assessment and Plan

## 2014-01-10 ENCOUNTER — Encounter (HOSPITAL_COMMUNITY): Payer: Self-pay | Admitting: Cardiovascular Disease

## 2014-06-03 ENCOUNTER — Ambulatory Visit: Payer: Self-pay | Admitting: Cardiovascular Disease

## 2014-06-03 NOTE — Progress Notes (Signed)
Patient ID: Jared Bryan, male   DOB: [birth year], 59 y.o.   MRN: [id number] 60 y.o.  smoker with family history of CAD. Poor medical f/u due to previous lack of insurance. Dysphagia started on Dexilant and paxil 09/20/12 No motivation to quit smoking Hasn't tried in 20 years. No CXR this year. Mild cough. He seems to have anger issues and does not like to see doctors. SSCP seen in [facility] 9 years ago R/O and refused to be admitted. Evaluated by Dr Clovis Riley 2 years ago for chest pain and again refused to have nuclear study. His SSCP is muscular sounding. Some pain on twisting Intermitant and activity more limited by dyspnea. Clinical COPD. Still works as Designer, fashion/clothing. Feels acid blocker has helped his dysphagia but has not seen GI doctor yet  11/06/12 had normal ETT   Since then continues to have SSCP  Remains atypical sharp but not pleuritic  Seen in [place] by [facility] and ndicates normal stress echo 2015   Despite two normal stress tests he contnues to have chest pain with multiple risk factors.     Cath September 2015 reviewed and no critical CAD.  Superdominant RCA with 40% D2 stenosis only  Sees Shultz in [employer] Needs new nitro Indicates cholesterol and CXR ok this year   Working hard doing roofing  Climbs ladders routinely    ROS: Denies fever, malais, weight loss, blurry vision, decreased visual acuity, cough, sputum, SOB, hemoptysis, pleuritic pain, palpitaitons, heartburn, abdominal pain, melena, lower extremity edema, claudication, or rash.  All other systems reviewed and negative  General: Affect appropriate Chronically ill male  HEENT: normal Neck supple with no adenopathy JVP normal no bruits no thyromegaly Lungs COPD no wheezing and good diaphragmatic motion Heart:  S1/S2 no murmur, no rub, gallop or click PMI normal Abdomen: benighn, BS positve, no tenderness, no AAA no bruit.  No HSM or HJR Distal pulses intact with no bruits No edema Neuro non-focal Skin warm and dry No  muscular weakness   Current Outpatient Prescriptions  Medication Sig Dispense Refill  . acetaminophen (TYLENOL) 500 MG tablet Take 500 mg by mouth every 6 (six) hours as needed for mild pain.     . Aclidinium Bromide (TUDORZA PRESSAIR) 400 MCG/ACT AEPB Inhale 1 puff into the lungs daily as needed (for shortness of breath).     . cetirizine (ZYRTEC) 10 MG tablet Take 10 mg by mouth daily as needed for allergies.    [name] HYDROcodone-acetaminophen (NORCO/VICODIN) 5-325 MG per tablet Take 0.5-1 tablets by mouth every 4 (four) hours as needed for moderate pain.    [name] labetalol (NORMODYNE) 200 MG tablet Take 100 mg by mouth 2 (two) times daily.    . mupirocin ointment (BACTROBAN) 2 % Apply 1 application topically 2 (two) times daily as needed (for rash).     . nitroGLYCERIN (NITROSTAT) 0.4 MG SL tablet Place 0.4 mg under the tongue every 5 (five) minutes as needed for chest pain.    [name] PARoxetine (PAXIL) 20 MG tablet Take 10-20 mg by mouth every morning. *will take 10-20 mg once daily depending on how patient feels*    . ranitidine (ZANTAC) 150 MG tablet Take 150-300 mg by mouth 2 (two) times daily as needed for heartburn.     No current facility-administered medications for this visit.    Allergies  Niacin and related  Electrocardiogram:   10/04/13 SR rate 57 normal   Assessment and Plan Chest Pain:  Resolved cath 10/2013 40% diagonal disease  F/u  primary risk factor modification. New nitro called in ASA COPD:  Clinical discussed smoking cessation less than 10 minutes no motivation to quit CXR in [place] GERD:  Continue H2 blocker low carb diet and weight loss  Depression:  Paxil mood seems better than last visit

## 2014-06-05 ENCOUNTER — Ambulatory Visit (INDEPENDENT_AMBULATORY_CARE_PROVIDER_SITE_OTHER): Payer: 59 | Admitting: Cardiovascular Disease

## 2014-06-05 ENCOUNTER — Encounter: Payer: Self-pay | Admitting: Cardiovascular Disease

## 2014-06-05 VITALS — BP 102/64 | HR 72 | Ht 64.0 in | Wt 176.8 lb

## 2014-06-05 DIAGNOSIS — I2510 Atherosclerotic heart disease of native coronary artery without angina pectoris: Secondary | ICD-10-CM

## 2014-06-05 DIAGNOSIS — I2583 Coronary atherosclerosis due to lipid rich plaque: Principal | ICD-10-CM

## 2014-06-05 MED ORDER — NITROGLYCERIN 0.4 MG SL SUBL: 0.4000 mg | SUBLINGUAL_TABLET | SUBLINGUAL | Status: DC | PRN

## 2014-06-05 NOTE — Patient Instructions (Signed)
Medication Instructions:  Your physician recommends that you continue on your current medications as directed. Please refer to the Current Medication list given to you today.   Labwork: NONE  Testing/Procedures: NONE  Follow-Up: Your physician wants you to follow-up in: 1 year with Dr. Eden EmmsNishan. You will receive a reminder letter in the mail two months in advance. If you don't receive a letter, please call our office to schedule the follow-up appointment.   Any Other Special Instructions Will Be Listed Below (If Applicable).

## 2015-05-15 DIAGNOSIS — Z0000 Encounter for general adult medical examination without abnormal findings: Secondary | ICD-10-CM | POA: Diagnosis not present

## 2015-05-15 DIAGNOSIS — D519 Vitamin B12 deficiency anemia, unspecified: Secondary | ICD-10-CM | POA: Diagnosis not present

## 2015-05-15 DIAGNOSIS — R7301 Impaired fasting glucose: Secondary | ICD-10-CM | POA: Diagnosis not present

## 2015-05-15 DIAGNOSIS — I10 Essential (primary) hypertension: Secondary | ICD-10-CM | POA: Diagnosis not present

## 2015-05-15 DIAGNOSIS — E669 Obesity, unspecified: Secondary | ICD-10-CM | POA: Diagnosis not present

## 2015-05-15 DIAGNOSIS — E785 Hyperlipidemia, unspecified: Secondary | ICD-10-CM | POA: Diagnosis not present

## 2015-06-13 DIAGNOSIS — Z6831 Body mass index (BMI) 31.0-31.9, adult: Secondary | ICD-10-CM | POA: Diagnosis not present

## 2015-06-13 DIAGNOSIS — G4733 Obstructive sleep apnea (adult) (pediatric): Secondary | ICD-10-CM | POA: Diagnosis not present

## 2015-06-19 ENCOUNTER — Telehealth: Payer: Self-pay | Admitting: Pulmonary Disease

## 2015-06-19 NOTE — Telephone Encounter (Signed)
[facility] has opening 07/08/15 @ 10:30am, have placed hold. If pt does not return call by tomorrow noon will release hold.

## 2015-06-20 NOTE — Telephone Encounter (Signed)
Patient's wife called back and said that time will work, scheduled the appointment.  May close encounter.

## 2015-07-08 ENCOUNTER — Ambulatory Visit (INDEPENDENT_AMBULATORY_CARE_PROVIDER_SITE_OTHER): Payer: BLUE CROSS/BLUE SHIELD | Admitting: Pulmonary Disease

## 2015-07-08 ENCOUNTER — Encounter: Payer: Self-pay | Admitting: Pulmonary Disease

## 2015-07-08 ENCOUNTER — Ambulatory Visit (INDEPENDENT_AMBULATORY_CARE_PROVIDER_SITE_OTHER)
Admission: RE | Admit: 2015-07-08 | Discharge: 2015-07-08 | Disposition: A | Discharge status: Home / Self Care | Payer: BLUE CROSS/BLUE SHIELD | Source: Ambulatory Visit | Attending: Pulmonary Disease | Admitting: Pulmonary Disease

## 2015-07-08 VITALS — BP 126/80 | HR 68 | Ht 63.25 in | Wt 187.6 lb

## 2015-07-08 DIAGNOSIS — J449 Chronic obstructive pulmonary disease, unspecified: Secondary | ICD-10-CM

## 2015-07-08 DIAGNOSIS — G4733 Obstructive sleep apnea (adult) (pediatric): Secondary | ICD-10-CM | POA: Diagnosis not present

## 2015-07-08 DIAGNOSIS — R05 Cough: Secondary | ICD-10-CM | POA: Diagnosis not present

## 2015-07-08 DIAGNOSIS — J441 Chronic obstructive pulmonary disease with (acute) exacerbation: Secondary | ICD-10-CM

## 2015-07-08 NOTE — Assessment & Plan Note (Signed)
Sleep study will be scheduled-based on this you may need a CPAP machine Pretest probability is high  Given excessive daytime somnolence, narrow pharyngeal exam, witnessed apneas & loud snoring, obstructive sleep apnea is very likely & an overnight polysomnogram will be scheduled as a split study. The pathophysiology of obstructive sleep apnea , it's cardiovascular consequences & modes of treatment including CPAP were discused with the patient in detail & they evidenced understanding.

## 2015-07-08 NOTE — Progress Notes (Signed)
Subjective:    Patient ID: Jared Bryan, male    DOB: 05/22/1955, 60 y.o.   MRN: [id number]  HPI  Chief Complaint  Patient presents with  . Sleep Consult    Referred by Dr. Tomasa BlaseSchultz; falling asleep constantly, even falls asleep driving.  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  Epworth sleepiness score is 24 and he reports sleepiness in various activities such as watching TV, sitting and reading or as a passenger in a car. He has had problems driving and has on one occasion found himself in a different lane. He works as a Designer, fashion/clothingroofer and had a fall with vertebral fractures and is seen the neurosurgeon. Bedtime is between 9 and 11 PM, sleep latency is minimal, he sleeps on his side with one pillow, reports 10-15 nocturnal awakenings and states that sometimes he doesn't sleep much at all but his wife feels that there is some sleep state misperception and he likely sleeps for longer than he is admitting to. He is out of bed by 7 AM to start his day-feeling tired with occasional dryness of mouth and headaches.  There is no history suggestive of cataplexy, sleep paralysis or parasomnias His wife has noted loud snoring and occasional choking and gasping episodes in the sleep  He also reports a constant dry cough and mild dyspnea on exertion. He does not have wheezing or frequent chest colds Spirometry shows FEV1 of 68% with a ratio of 72 and FVC of 77% small airways were decreased at 45% He has undergone a cardiac evaluation including a negative cath and excessive stress testing   Past Medical History  Diagnosis Date  . Reflux   . Thoracic spine fracture (HCC)   . GERD (gastroesophageal reflux disease)   .  Hyperlipidemia   . Dizziness and giddiness   . Other specified disease of hair and hair follicles   . Closed fracture of dorsal (thoracic) vertebra without mention of spinal cord injury     Past Surgical History  Procedure Laterality Date  . Appendectomy    . Left heart catheterization with coronary angiogram N/A 10/11/2013    Procedure: LEFT HEART CATHETERIZATION WITH CORONARY ANGIOGRAM;  Surgeon: Wendall StadePeter C Nishan, MD;  [facility];  Service: Cardiovascular;  Laterality: N/A;    Allergies  Allergen Reactions  . Niacin And Related Rash    Social History   Social History  . Marital Status: Married    Spouse Name: N/A  . Number of Children: N/A  . Years of Education: N/A   Occupational History  . Not on file.   Social History Main Topics  . Smoking status: Current Every Day Smoker -- 2.00 packs/day for 50 years    Types: Cigarettes  . Smokeless tobacco: Former NeurosurgeonUser  . Alcohol Use: No  . Drug Use: No  . Sexual Activity: Not on file   Other Topics Concern  . Not on file   Social History Narrative     Family History  Problem Relation Age of Onset  . Heart disease Father   . Cancer Paternal Grandfather     lung  . Hypertension Father   . Hypercholesterolemia Father   . Diabetes Mother  Review of Systems  neg for any significant sore throat, dysphagia, itching, sneezing, nasal congestion or excess/ purulent secretions, fever, chills, sweats, unintended wt loss, pleuritic or exertional cp, hempoptysis, orthopnea pnd or change in chronic leg swelling.   Also denies presyncope, palpitations, heartburn, abdominal pain, nausea, vomiting, diarrhea or change in bowel or urinary habits, dysuria,hematuria, rash, arthralgias, visual complaints, headache, numbness weakness or ataxia.     Objective:   Physical Exam  Gen. Pleasant, well-nourished, in no distress, normal affect ENT - no lesions, no post nasal drip Neck: No JVD, no thyromegaly, no carotid  bruits Lungs: no use of accessory muscles, no dullness to percussion, clear without rales or rhonchi  Cardiovascular: Rhythm regular, heart sounds  normal, no murmurs or gallops, no peripheral edema Abdomen: soft and non-tender, no hepatosplenomegaly, BS normal. Musculoskeletal: No deformities, no cyanosis or clubbing Neuro:  alert, non focal        Assessment & Plan:

## 2015-07-08 NOTE — Assessment & Plan Note (Signed)
lung function is decreased to 68%- you have stage II COPD Chest x-ray today You have to QUIT smoking!  Today we focused on smoking cessation as the main intervention-he was not ready to make quit attempt but said that he would think about it

## 2015-07-08 NOTE — Patient Instructions (Addendum)
lung function is decreased to 68%- you have stage II COPD Chest x-ray today You have to QUIT smoking!  Sleep study will be scheduled-based on this you may need a CPAP machine

## 2015-07-28 DIAGNOSIS — J019 Acute sinusitis, unspecified: Secondary | ICD-10-CM | POA: Diagnosis not present

## 2015-07-28 DIAGNOSIS — J309 Allergic rhinitis, unspecified: Secondary | ICD-10-CM | POA: Diagnosis not present

## 2015-07-28 DIAGNOSIS — Z6830 Body mass index (BMI) 30.0-30.9, adult: Secondary | ICD-10-CM | POA: Diagnosis not present

## 2015-08-04 DIAGNOSIS — K219 Gastro-esophageal reflux disease without esophagitis: Secondary | ICD-10-CM | POA: Diagnosis not present

## 2015-08-04 DIAGNOSIS — Z6830 Body mass index (BMI) 30.0-30.9, adult: Secondary | ICD-10-CM | POA: Diagnosis not present

## 2015-08-04 DIAGNOSIS — K623 Rectal prolapse: Secondary | ICD-10-CM | POA: Diagnosis not present

## 2015-08-17 ENCOUNTER — Ambulatory Visit (HOSPITAL_BASED_OUTPATIENT_CLINIC_OR_DEPARTMENT_OTHER): Payer: BLUE CROSS/BLUE SHIELD | Attending: Pulmonary Disease | Admitting: Pulmonary Disease

## 2015-08-17 VITALS — Ht 64.0 in | Wt 185.0 lb

## 2015-08-17 DIAGNOSIS — G4733 Obstructive sleep apnea (adult) (pediatric): Secondary | ICD-10-CM | POA: Diagnosis not present

## 2015-08-17 DIAGNOSIS — G4719 Other hypersomnia: Secondary | ICD-10-CM | POA: Diagnosis not present

## 2015-08-17 DIAGNOSIS — R0683 Snoring: Secondary | ICD-10-CM | POA: Insufficient documentation

## 2015-08-17 DIAGNOSIS — R5383 Other fatigue: Secondary | ICD-10-CM | POA: Insufficient documentation

## 2015-08-18 ENCOUNTER — Other Ambulatory Visit (HOSPITAL_BASED_OUTPATIENT_CLINIC_OR_DEPARTMENT_OTHER): Payer: Self-pay

## 2015-08-18 DIAGNOSIS — G4733 Obstructive sleep apnea (adult) (pediatric): Secondary | ICD-10-CM

## 2015-08-19 ENCOUNTER — Telehealth: Payer: Self-pay | Admitting: Pulmonary Disease

## 2015-08-19 DIAGNOSIS — G4730 Sleep apnea, unspecified: Secondary | ICD-10-CM | POA: Diagnosis not present

## 2015-08-19 DIAGNOSIS — G4733 Obstructive sleep apnea (adult) (pediatric): Secondary | ICD-10-CM

## 2015-08-19 NOTE — Telephone Encounter (Signed)
Please let him know that sleep study showed severe OSA Prescription will be sent to DME for-  BiPAP therapy on 12/8 cm H2O with a Small size Fisher&Paykel Full Face Mask Simplus mask and heated humidification. Download in 4 weeks  Office visit in 6 weeks with me

## 2015-08-19 NOTE — Procedures (Signed)
Patient Name: Jared Bryan, Jared Bryan Date: 08/17/2015 Gender: Male D.O.B: [birth year] Age (years): 22 Referring Provider: Kara Mead MD, ABSM Height (inches): 64 Interpreting Physician: Kara Mead MD, ABSM Weight (lbs): 185 RPSGT: Joni Reining BMI: 32 MRN: [id number] Neck Size: 17.00   CLINICAL INFORMATION The patient is referred for a split night study Due to excessive daytime somnolence and fatigue and loud snoring   SLEEP STUDY TECHNIQUE As per the AASM Manual for the Scoring of Sleep and Associated Events v2.3 (April 2016) with a hypopnea requiring 4% desaturations. The channels recorded and monitored were frontal, central and occipital EEG, electrooculogram (EOG), submentalis EMG (chin), nasal and oral airflow, thoracic and abdominal wall motion, anterior tibialis EMG, snore microphone, electrocardiogram, and pulse oximetry. Bi-level positive airway pressure (BiPAP) was initiated when the patient met split night criteria and was titrated according to treat sleep-disordered breathing.   RESPIRATORY PARAMETERS Diagnostic Total AHI (/hr): 56.7 RDI (/hr): 59.3 OA Index (/hr): 34.8 CA Index (/hr): 1.5 REM AHI (/hr): N/A NREM AHI (/hr): 56.7 Supine AHI (/hr): 86.7 Non-supine AHI (/hr): 53.78 Min O2 Sat (%): 77.00 Mean O2 (%): 89.54 Time below 88% (min): 38.9   Titration Optimal IPAP Pressure (cm): 12 Optimal EPAP Pressure (cm): 8 AHI at Optimal Pressure (/hr): 0.0 Min O2 at Optimal Pressure (%): 90.0 Sleep % at Optimal (%): 88 Supine % at Optimal (%): 0       SLEEP ARCHITECTURE The study was initiated at 9:54:00 PM and terminated at 4:17:18 AM. The total recorded time was 383.3 minutes. EEG confirmed total sleep time was 310.5 minutes yielding a sleep efficiency of 81.0%. Sleep onset after lights out was 6.4 minutes with a REM latency of 231.0 minutes. The patient spent 9.50% of the night in stage N1 sleep, 60.39% in stage N2 sleep, 0.00% in stage N3 and 30.11% in REM. Wake after  sleep onset (WASO) was 66.4 minutes. The Arousal Index was 11.2/hour.   LEG MOVEMENT DATA The total Periodic Limb Movements of Sleep (PLMS) were 0. The PLMS index was 0.00 .   CARDIAC DATA The 2 lead EKG demonstrated sinus rhythm. The mean heart rate was 64.07 beats per minute. Other EKG findings include: None.   IMPRESSIONS - Severe obstructive sleep apnea occurred during the diagnostic portion of the study (AHI = 56.7 /hour). - He could not tolerate CPAP. An optimal BiPAP pressure was selected for this patient ( 12 /8 cm of water) - No significant central sleep apnea occurred during the diagnostic portion of the study (CAI = 1.5/hour). - Severe oxygen desaturation was noted during the diagnostic portion of the study (Min O2 = 77.00%). - The patient snored with Moderate snoring volume during the diagnostic portion of the study. - No cardiac abnormalities were noted during this study. - Clinically significant periodic limb movements of sleep did not occur during the study.   DIAGNOSIS - Obstructive Sleep Apnea (327.23 [G47.33 ICD-10])   RECOMMENDATIONS - Trial of BiPAP therapy on 12/8 cm H2O with a Small size Fisher&Paykel Full Face Mask Simplus mask and heated humidification. - Avoid alcohol, sedatives and other CNS depressants that may worsen sleep apnea and disrupt normal sleep architecture. - Sleep hygiene should be reviewed to assess factors that may improve sleep quality. - Weight management and regular exercise should be initiated or continued. - Return to Sleep Center for re-evaluation.    Kara Mead MD. [facility]. [facility]   08/19/2015

## 2015-08-20 NOTE — Telephone Encounter (Signed)
BiPAP has been ordered. Patient aware. Patient scheduled for follow up. Patient aware to arrive 15 min early for appt. Nothing further needed.

## 2015-08-25 DIAGNOSIS — R159 Full incontinence of feces: Secondary | ICD-10-CM | POA: Diagnosis not present

## 2015-08-26 DIAGNOSIS — G4733 Obstructive sleep apnea (adult) (pediatric): Secondary | ICD-10-CM | POA: Diagnosis not present

## 2015-08-29 ENCOUNTER — Encounter: Payer: Self-pay | Admitting: Internal Medicine

## 2015-09-03 ENCOUNTER — Institutional Professional Consult (permissible substitution): Payer: Self-pay | Admitting: Pulmonary Disease

## 2015-09-26 DIAGNOSIS — G4733 Obstructive sleep apnea (adult) (pediatric): Secondary | ICD-10-CM | POA: Diagnosis not present

## 2015-10-07 ENCOUNTER — Ambulatory Visit: Payer: BLUE CROSS/BLUE SHIELD | Admitting: Internal Medicine

## 2015-10-08 ENCOUNTER — Ambulatory Visit: Payer: BLUE CROSS/BLUE SHIELD | Admitting: Adult Health

## 2015-10-21 ENCOUNTER — Encounter: Payer: Self-pay | Admitting: Pulmonary Disease

## 2015-10-21 ENCOUNTER — Ambulatory Visit (INDEPENDENT_AMBULATORY_CARE_PROVIDER_SITE_OTHER): Payer: BLUE CROSS/BLUE SHIELD | Admitting: Pulmonary Disease

## 2015-10-21 DIAGNOSIS — J449 Chronic obstructive pulmonary disease, unspecified: Secondary | ICD-10-CM

## 2015-10-21 DIAGNOSIS — G4733 Obstructive sleep apnea (adult) (pediatric): Secondary | ICD-10-CM

## 2015-10-21 NOTE — Assessment & Plan Note (Signed)
Smoking cessation again emphasized We'll hold off on medications

## 2015-10-21 NOTE — Patient Instructions (Signed)
BiPAP is set at 12/8 cm and is very effective Use this whenever you sleep  Try to cut down on  smoking

## 2015-10-21 NOTE — Progress Notes (Signed)
   Subjective:    Patient ID: Jared Bryan, male    DOB: 09-09-1955, 60 y.o.   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    10/21/2015  Chief Complaint  Patient presents with  . Follow-up    doing well on BiPAP.  no concerns.   He continues to smoke about a pack per day-attributes this to stress on his job His breathing is at baseline He was started on BiPAP machine after his sleep study-did not tolerate CPAP during the study. He had remarkable improvement in his daytime somnolence and has more energy, less sleepy in the afternoon. Wife reports loud snoring has resolved and he definitely sleeps better Download shows good compliance at least 5 hours on average, no residual events on BiPAP 12/8 cm with mild leak  Significant tests/ events 07/2015 Spirometry - FEV1 of 68% with a ratio of 72 and FVC of 77% small airways were decreased at 45%  PSG 08/2015 AHI 57/h, corrected by BiPAP 12/8   Review of Systems neg for any significant sore throat, dysphagia, itching, sneezing, nasal congestion or excess/ purulent secretions, fever, chills, sweats, unintended wt loss, pleuritic or exertional cp, hempoptysis, orthopnea pnd or change in chronic leg swelling. Also denies presyncope, palpitations, heartburn, abdominal pain, nausea, vomiting, diarrhea or change in bowel or urinary habits, dysuria,hematuria, rash, arthralgias, visual complaints, headache, numbness weakness or ataxia.     Objective:   Physical Exam   Gen. Pleasant, obese, in no distress ENT - no lesions, no post nasal drip Neck: No JVD, no thyromegaly, no carotid bruits Lungs: no use of accessory muscles, no dullness to percussion, decreased without rales or rhonchi  Cardiovascular: Rhythm regular, heart sounds  normal, no murmurs or gallops, no peripheral edema Musculoskeletal:  No deformities, no cyanosis or clubbing , no tremors        Assessment & Plan:

## 2015-10-21 NOTE — Assessment & Plan Note (Signed)
BiPAP is set at 12/8 cm and is very effective Use this whenever you sleep   Weight loss encouraged, compliance with goal of at least 4-6 hrs every night is the expectation. Advised against medications with sedative side effects Cautioned against driving when sleepy - understanding that sleepiness will vary on a day to day basis

## 2015-10-27 DIAGNOSIS — G4733 Obstructive sleep apnea (adult) (pediatric): Secondary | ICD-10-CM | POA: Diagnosis not present

## 2015-11-04 ENCOUNTER — Encounter: Payer: Self-pay | Admitting: Internal Medicine

## 2015-11-04 ENCOUNTER — Ambulatory Visit (INDEPENDENT_AMBULATORY_CARE_PROVIDER_SITE_OTHER): Payer: BLUE CROSS/BLUE SHIELD | Admitting: Internal Medicine

## 2015-11-04 VITALS — BP 142/84 | HR 64 | Ht 62.5 in | Wt 189.0 lb

## 2015-11-04 DIAGNOSIS — R151 Fecal smearing: Secondary | ICD-10-CM | POA: Diagnosis not present

## 2015-11-04 DIAGNOSIS — R6889 Other general symptoms and signs: Secondary | ICD-10-CM | POA: Diagnosis not present

## 2015-11-04 DIAGNOSIS — K641 Second degree hemorrhoids: Secondary | ICD-10-CM

## 2015-11-04 NOTE — Progress Notes (Signed)
Jared Bryan 60 y.o. [birth year] [id number] Referred by: Raechel AcheMoorhead, Jared S., DO  Assessment & Plan:   1. Fecal smearing   2. Abnormal digital rectal exam - ? dyssynergic defecation   3. Prolapsed internal hemorrhoids, grade 2      Anorectal manometry to evaluate anorectal function and possible recommendation for pelvic floor therapy ? Banding of hemorrhoids which could help Review colonoscopy/EGD Benefiber 1 tbsp/day  ? Relation to spine injury, Doubt  I appreciate the opportunity to care for this patient. CC: YSAYTKZ,SWFUXNASCHULTZ,DOUGLAS E, MD Dr. Lars MageAndrew Bryan Subjective:   Chief Complaint: leakage of stool  HPI Patient is a 60 year old white man here at the request of Dr. Jacki ConesMorehead Bryan who evaluated the patient in July and thought that he probably had decreased anal sphincter tone and would need further evaluation. The patient said a year ago he was fine, or perhaps a little more than a year ago when he had a colonoscopy by Dr. Charm Bryan. Ever since then he has noted occasional fecal soiling. He told Dr. Vinson Bryan that it was worse when he picks up something heavy and these had protrusion of rectal mucosa perhaps as large as his fist. Dr. Vinson Bryan thought that he denied protrusion of hemorrhoids. The patient says that he feels like his anus is now on narrow slit instead of a round hole. He does have a history of straining to stool though he is trying to avoid that and take more time with defecation. He does not use any fiber supplements. He has to cleanse himself numerous times after defecation during and after he leaves the bathroom. He had rectal bleeding prior to the colonoscopy, palliative polyp removed. He was having dysphagia and he was evaluated and EGD in the patient said he had ulcers and was put on a PPI and his dysphagia is gone. I do not have those records at but we have requested them.  Allergies  Allergen Reactions  . Niacin And Related     Flushing feeling   Outpatient  Medications Prior to Visit  Medication Sig Dispense Refill  . acetaminophen (TYLENOL) 500 MG tablet Take 500 mg by mouth every 6 (six) hours as needed for mild pain.    [name]. ALPRAZolam (XANAX) 0.25 MG tablet Take 0.25 mg by mouth at bedtime as needed for anxiety.    [name]. amLODipine (NORVASC) 5 MG tablet Take 5 mg by mouth daily.    . cetirizine (ZYRTEC) 10 MG tablet Take 10 mg by mouth daily as needed for allergies.    . nitroGLYCERIN (NITROSTAT) 0.4 MG SL tablet Place 1 tablet (0.4 mg total) under the tongue every 5 (five) minutes as needed for chest pain. 25 tablet 1  . pantoprazole (PROTONIX) 40 MG tablet Take 40 mg by mouth daily.    [name]. PARoxetine (PAXIL) 20 MG tablet Take 20 mg by mouth daily. *will take 10-20 mg once daily depending on how patient feels*     No facility-administered medications prior to visit.    Past Medical History:  Diagnosis Date  . Anxiety   . Arthritis   . Closed fracture of dorsal (thoracic) vertebra without mention of spinal cord injury   . Colon polyps   . COPD (chronic obstructive pulmonary disease) (HCC)   . Depression   . Dizziness and giddiness   . Emphysema lung (HCC)   . GERD (gastroesophageal reflux disease)   . Hyperlipidemia   . Multiple gastric ulcers   . Other specified disease of hair and hair follicles   .  Sleep apnea   . Thoracic spine fracture [facility])    Past Surgical History:  Procedure Laterality Date  . APPENDECTOMY    . CERVICAL FUSION    . INGUINAL HERNIA REPAIR Left    with mesh  . LEFT HEART CATHETERIZATION WITH CORONARY ANGIOGRAM N/A 10/11/2013   Procedure: LEFT HEART CATHETERIZATION WITH CORONARY ANGIOGRAM;  Surgeon: [name], MD;  Location: [facility] CATH LAB;  Service: Cardiovascular;  Laterality: N/A;   Social History   Social History  . Marital status: Married    Spouse name: N/A  . Number of children: 1  . Years of education: N/A   Occupational History  . self contractor    Social History Main Topics  . Smoking  status: Current Every Day Smoker    Packs/day: 1.50    Years: 50.00    Types: Cigarettes  . Smokeless tobacco: Former Neurosurgeon    Types: Chew  . Alcohol use No  . Drug use: No  . Sexual activity: Not Asked   Other Topics Concern  . None   Social History Narrative  . None   Family History  Problem Relation Age of Onset  . Heart disease Father   . Hypertension Father   . Hypercholesterolemia Father   . Diabetes Father   . Heart attack Father   . Lung cancer Paternal Grandfather   . Diabetes Maternal Grandmother     Review of Systems As per history of present illness. He has chronic back pain, he had a T 12 spinal fracture after falling off a roof. He declined surgery to put rods in his bag he says. He has some dyspnea and depressed mood. All other review of systems are negative.  Objective:   Physical Exam BP (!) 142/84 (BP Location: Left Arm, Patient Position: Sitting, Cuff Size: Normal)   Pulse 64 Comment: irregular  Ht 5' 2.5" (1.588 m) Comment: height measured without shoes  Wt 189 lb (85.7 kg)   BMI 34.02 kg/m  NAD - obese wm Eyes anicteric Lungs CTA Cor S1S2 no rmg abd protuberant soft NT obese BS +  Rectal:  Small anal tags,  Anal wink intact sl decreased rectal tone, formed brown stool no mass, NL prostate Decent voluntary squeeze Inappropriate tightening and decreased descent with simulated defcation  Anoscopy Gr 2 internal hemorrhoids all 3 positions  Extremities: No cyanosis clubbing or edema Neurologic: Alert and oriented 3 Skin:  No rash  Data reviewed: Office note from surgery, 08/25/2015

## 2015-11-04 NOTE — Patient Instructions (Signed)
  You have been scheduled to have an anorectal manometry at [facility] on 11/19/15 at 12:30pm. Please arrive 30 minutes prior to your appointment time for registration (1st floor of the hospital-admissions).  Please make certain to use 1 Fleets enema 2 hours prior to coming for your appointment. You can purchase Fleets enemas from the laxative section at your drug store. You should not eat anything during the two hours prior to the procedure. You may take regular medications with small sips of water at least 2 hours prior to the study.  Anorectal manometry is a test performed to evaluate patients with constipation or fecal incontinence. This test measures the pressures of the anal sphincter muscles, the sensation in the rectum, and the neural reflexes that are needed for normal bowel movements.  THE PROCEDURE The test takes approximately 30 minutes to 1 hour. You will be asked to change into a hospital gown. A technician or nurse will explain the procedure to you, take a brief health history, and answer any questions you may have. The patient then lies on his or her left side. A small, flexible tube, about the size of a thermometer, with a balloon at the end is inserted into the rectum. The catheter is connected to a machine that measures the pressure. During the test, the small balloon attached to the catheter may be inflated in the rectum to assess the normal reflex pathways. The nurse or technician may also ask the person to squeeze, relax, and push at various times. The anal sphincter muscle pressures are measured during each of these maneuvers. To squeeze, the patient tightens the sphincter muscles as if trying to prevent anything from coming out. To push or bear down, the patient strains down as if trying to have a bowel movement.   Please use benefiber daily, 1 tablespoon, handout provided.    I appreciate the opportunity to care for you. Stan Headarl Gessner, MD, [facility]

## 2015-11-07 ENCOUNTER — Ambulatory Visit: Payer: BLUE CROSS/BLUE SHIELD | Admitting: Internal Medicine

## 2015-11-17 DIAGNOSIS — R7301 Impaired fasting glucose: Secondary | ICD-10-CM | POA: Diagnosis not present

## 2015-11-17 DIAGNOSIS — E669 Obesity, unspecified: Secondary | ICD-10-CM | POA: Diagnosis not present

## 2015-11-17 DIAGNOSIS — D519 Vitamin B12 deficiency anemia, unspecified: Secondary | ICD-10-CM | POA: Diagnosis not present

## 2015-11-17 DIAGNOSIS — E785 Hyperlipidemia, unspecified: Secondary | ICD-10-CM | POA: Diagnosis not present

## 2015-11-17 DIAGNOSIS — Z1389 Encounter for screening for other disorder: Secondary | ICD-10-CM | POA: Diagnosis not present

## 2015-11-17 DIAGNOSIS — I10 Essential (primary) hypertension: Secondary | ICD-10-CM | POA: Diagnosis not present

## 2015-11-19 ENCOUNTER — Ambulatory Visit (HOSPITAL_COMMUNITY)
Admission: RE | Admit: 2015-11-19 | Discharge: 2015-11-19 | Disposition: A | Discharge status: Home / Self Care | Payer: BLUE CROSS/BLUE SHIELD | Source: Ambulatory Visit | Attending: Internal Medicine | Admitting: Internal Medicine

## 2015-11-19 ENCOUNTER — Encounter (HOSPITAL_COMMUNITY)
Admission: RE | Disposition: A | Discharge status: Home / Self Care | Payer: Self-pay | Source: Ambulatory Visit | Attending: Internal Medicine

## 2015-11-19 DIAGNOSIS — K6289 Other specified diseases of anus and rectum: Secondary | ICD-10-CM | POA: Diagnosis not present

## 2015-11-19 DIAGNOSIS — R159 Full incontinence of feces: Secondary | ICD-10-CM

## 2015-11-19 HISTORY — PX: ANAL RECTAL MANOMETRY: SHX6358

## 2015-11-19 SURGERY — MANOMETRY, ANORECTAL

## 2015-11-19 NOTE — Progress Notes (Signed)
Anal manometry done per protocol. Pt tolerated well without complications. Balloon expulsion test done. Pt able to pass 50cc balloon in 45 seconds. Report to be sent to Dr. Marvell FullerGessner's office.

## 2015-11-20 ENCOUNTER — Encounter (HOSPITAL_COMMUNITY): Payer: Self-pay | Admitting: Internal Medicine

## 2015-11-26 DIAGNOSIS — G4733 Obstructive sleep apnea (adult) (pediatric): Secondary | ICD-10-CM | POA: Diagnosis not present

## 2015-12-02 DIAGNOSIS — R159 Full incontinence of feces: Secondary | ICD-10-CM

## 2015-12-08 ENCOUNTER — Telehealth: Payer: Self-pay | Admitting: Internal Medicine

## 2015-12-08 NOTE — Telephone Encounter (Signed)
Patient is calling for anorectal manometry results.  Please advise if you have seen the results.

## 2015-12-08 NOTE — Telephone Encounter (Signed)
Patients wife notified

## 2015-12-08 NOTE — Telephone Encounter (Signed)
Have not seen report - hopefully to ger read this weak

## 2015-12-08 NOTE — Progress Notes (Signed)
Let him know that there is some weakness and reduced sensitivity in the rectum. Pelvic floor PT reasonable to try to see if that helps and could also need hemorrhoid treatment.  Would try PT first and can refer to Eulis Fosterheryl Gray  Please print out report and this note and fax to his PCP Dr. Tomasa BlaseSchultz and also the surgeon Dr. Susann GivensMoorehead

## 2015-12-09 ENCOUNTER — Other Ambulatory Visit: Payer: Self-pay

## 2015-12-09 DIAGNOSIS — M6289 Other specified disorders of muscle: Secondary | ICD-10-CM

## 2015-12-10 ENCOUNTER — Ambulatory Visit: Payer: BLUE CROSS/BLUE SHIELD | Admitting: Internal Medicine

## 2015-12-10 NOTE — Progress Notes (Signed)
OK We can hold on PT referral unless done already until I see him 11/15

## 2015-12-15 ENCOUNTER — Ambulatory Visit: Payer: BLUE CROSS/BLUE SHIELD | Attending: Internal Medicine | Admitting: Physical Therapy

## 2015-12-15 DIAGNOSIS — M6281 Muscle weakness (generalized): Secondary | ICD-10-CM | POA: Diagnosis not present

## 2015-12-15 DIAGNOSIS — R279 Unspecified lack of coordination: Secondary | ICD-10-CM

## 2015-12-15 NOTE — Patient Instructions (Addendum)
Quick Contraction: Gravity Resisted (Sitting)    Sitting, quickly squeeze then fully relax pelvic floor. Perform _2__ sets of __5_. Rest for _1__ seconds between sets. Do _3__ times a day.  Copyright  VHI. All rights reserved.   Slow Contraction: Gravity Resisted (Sitting)    Sitting, slowly squeeze pelvic floor for _5__ seconds. Rest for _5__ seconds. Repeat _10__ times. Do __3_ times a day. Do not hold breath. Copyright  VHI. All rights reserved.  [facility] [address], [address] [place], [place] [address] Phone # [phone] Fax [phone]

## 2015-12-15 NOTE — Therapy (Addendum)
[facility] [address] [place], [place], [address] Phone: [phone]   Fax:  [phone]  Physical Therapy Evaluation  Patient Details  Name: Jared Bryan MRN: [id number] Date of Birth: 03-03-1955 Referring Provider: Dr. Silvano Rusk  Encounter Date: 12/15/2015      PT End of Session - 12/15/15 1456    Visit Number 1   Date for PT Re-Evaluation 03/08/16   PT Start Time 0930   PT Stop Time 1015   PT Time Calculation (min) 45 min   Activity Tolerance Patient tolerated treatment well   Behavior During Therapy [facility] for tasks assessed/performed      Past Medical History:  Diagnosis Date  . Anxiety   . Arthritis   . Closed fracture of dorsal (thoracic) vertebra without mention of spinal cord injury   . Colon polyps   . COPD (chronic obstructive pulmonary disease) ([place])   . Depression   . Dizziness and giddiness   . Emphysema lung ([place])   . GERD (gastroesophageal reflux disease)   . Hyperlipidemia   . Multiple gastric ulcers   . Other specified disease of hair and hair follicles   . Sleep apnea   . Thoracic spine fracture [facility])     Past Surgical History:  Procedure Laterality Date  . ANAL RECTAL MANOMETRY N/A 11/19/2015   Procedure: ANO RECTAL MANOMETRY;  Surgeon: Gatha Mayer, MD;  Location: [facility];  Service: Endoscopy;  Laterality: N/A;  . APPENDECTOMY    . CERVICAL FUSION    . INGUINAL HERNIA REPAIR Left    with mesh  . LEFT HEART CATHETERIZATION WITH CORONARY ANGIOGRAM N/A 10/11/2013   Procedure: LEFT HEART CATHETERIZATION WITH CORONARY ANGIOGRAM;  Surgeon: Josue Hector, MD;  [facility] CATH LAB;  Service: Cardiovascular;  Laterality: N/A;    There were no vitals filed for this visit.       Subjective Assessment - 12/15/15 0936    Subjective Patient had a colonoscopy due to toilet filling up of blood. He had ulcer in stomach. Patient had a polup and since then had difficulty with  fecal incontinence. Lifting heavy weight, jump off a ladder or back of truck, after wiping from a stool will cause fecal incontinence.  Patient has to use alot of toilet paper to clean himself. Patient is having hemmoroid removed on 12/17/2015.  Patient reports the hemmorroid will come out of the anus and cause fecal soiling.    Patient Stated Goals strengthen muscles of sphincter.    Currently in Pain? No/denies   Multiple Pain Sites No            [facility] PT Assessment - 12/15/15 0001      Assessment   Medical Diagnosis M62.89 Pelvic floor dysfunction   Referring Provider Dr. Silvano Rusk   Onset Date/Surgical Date 02/01/14   Prior Therapy None     Precautions   Precautions None     Restrictions   Weight Bearing Restrictions No     Balance Screen   Has the patient fallen in the past 6 months Yes   How many times? 15  due to right leg giving our since he broke his back in 2012   Has the patient had a decrease in activity level because of a fear of falling?  No   Is the patient reluctant to leave their home because of a fear of falling?  No     Home Ecologist residence  Prior Function   Level of Independence Independent   Vocation Full time employment   Dispensing optician, roofing business     Cognition   Overall Cognitive Status Within Functional Limits for tasks assessed     Observation/Other Assessments   Focus on Therapeutic Outcomes (FOTO)  58% limitation  42% limitation goal                 Pelvic Floor Special Questions - 12/15/15 0001    Skin Integrity Hemorroids   Biofeedback rest 8.34, 3 quick flicks max 19.62 uv avg. 4.49uv, 10 sec 6.15 uv, 20 sec 5.33 uv, rest 0.85 uv   Biofeedback sensor type Surface  rectal                  PT Education - 12/15/15 1454    Education provided Yes   Education Details pelvic floor contraction   Person(s) Educated Patient   Methods  Explanation;Demonstration;Verbal cues;Handout   Comprehension Returned demonstration;Verbalized understanding          PT Short Term Goals - 12/15/15 1506      PT SHORT TERM GOAL #1   Title independent with initial HEP   Time 4   Period Weeks   Status New     PT SHORT TERM GOAL #2   Title ability to contract for 5 seconds at 10 uv due to increased strength   Time 4   Period Weeks   Status New     PT SHORT TERM GOAL #3   Title after a bowel movement fecal soiling decreased >/= 25% due to increased tone of pelvic floor   Time 4   Period Weeks   Status New     PT SHORT TERM GOAL #4   Title understand how to perform abdominal bracing to prevent valsalva manevuer with lifting    Time 4   Period Weeks   Status New           PT Long Term Goals - 12/15/15 1508      PT LONG TERM GOAL #1   Title independent with HEP and how to progress himself   Time 12   Period Weeks   Status New     PT LONG TERM GOAL #2   Title Fecal soiling after bowel movement decreased >/= 75%   Time 12   Period Weeks   Status New     PT LONG TERM GOAL #3   Title contract for 10 seconds with above >/= 15 uv due to increased strength   Time 12   Period Weeks   Status New     PT LONG TERM GOAL #4   Title ability to lift items with fecal leakage decreased >/= 75%   Time 12   Period Weeks   Status New     PT LONG TERM GOAL #5   Title ability to jump off a ladder with fecal leakage decreased >/= 75%   Time 12   Period Weeks   Status New               Plan - 12/15/15 1458    Clinical Impression Statement Patient is a 60 year old male with fecal incontinence.  Patient broke his back in 2012 due to falling off a roof and has less feeling from the chest down.  Patient reports last year he had a colonoscopy and that is when  he had issues of fecal incontinence.  Patient reports no pain in  rectal area.  Patient has to change positions in standing and sitting due to lumbar pain.  Patient  does not wear pads.  He leaks bowel with lifting objects, jumping off a ladder at work, and heavy lifting.  Patient resting tone of pelvic floor is 0.5uv indicating low tone.  He is able to contract pelvic floor for 5 second averaging 6 uv.  Patient does not have endurance of the pelvic floor and quick contraction is at 10 uv.  Patient understands how to focus to isolate a pelvic floor contraction.  Patient is of low complexity evaluation due to fecal incontinence is stable and one comorbities such as decreased sensation of lower half of body that will impact care provided. Patient will benefit from skillled therapy to increase pelvic floor strength.    Rehab Potential Good   Clinical Impairments Affecting Rehab Potential broke his back in 2012   PT Frequency 1x / week   PT Duration 12 weeks   PT Treatment/Interventions Biofeedback;Therapeutic activities;Therapeutic exercise;Neuromuscular re-education;Patient/family education;Manual techniques   PT Next Visit Plan abdominal bracing, pelvic floor strength with increasing holding; toileting technique   PT Home Exercise Plan progress as needed   Recommended Other Services none   Consulted and Agree with Plan of Care Patient      Patient will benefit from skilled therapeutic intervention in order to improve the following deficits and impairments:     Visit Diagnosis: Muscle weakness (generalized) - Plan: PT plan of care cert/re-cert  Unspecified lack of coordination - Plan: PT plan of care cert/re-cert     Problem List Patient Active Problem List   Diagnosis Date Noted  . Incontinence of feces   . OSA (obstructive sleep apnea) 07/08/2015  . COPD (chronic obstructive pulmonary disease) ([place]) 07/08/2015  . Chest pain 10/09/2012  . Smoking 10/09/2012  . Reflux   . Thoracic spine fracture ([place])   . GERD (gastroesophageal reflux disease)   . Hyperlipidemia   . Dizziness and giddiness   . Other specified disease of hair and hair follicles    . Closed fracture of dorsal (thoracic) vertebra without mention of spinal cord injury     Earlie Counts, PT 12/15/15 3:13 PM   [facility] [address] [place], [place], [address] Phone: [phone]   Fax:  [phone]  Name: Julies Carmickle MRN: [id number] Date of Birth: 07-25-1955  PHYSICAL THERAPY DISCHARGE SUMMARY  Visits from Start of Care: 1  Current functional level related to goals / functional outcomes: Patient has not been to therapy since initial evaluation on 12/15/2015.  See above.   Remaining deficits: See above   Education / Equipment: HEP Plan:                                                    Patient goals were not met. Patient is being discharged due to not returning since the last visit. Thank you for the referral. Earlie Counts, PT 03/04/16 3:22 PM   ?????

## 2015-12-17 ENCOUNTER — Ambulatory Visit (INDEPENDENT_AMBULATORY_CARE_PROVIDER_SITE_OTHER): Payer: BLUE CROSS/BLUE SHIELD | Admitting: Internal Medicine

## 2015-12-17 ENCOUNTER — Encounter: Payer: Self-pay | Admitting: Internal Medicine

## 2015-12-17 VITALS — BP 132/70 | HR 70 | Ht 65.75 in | Wt 191.1 lb

## 2015-12-17 DIAGNOSIS — K641 Second degree hemorrhoids: Secondary | ICD-10-CM | POA: Insufficient documentation

## 2015-12-17 NOTE — Progress Notes (Signed)
   Returns - sxs of prolapse grade2-3 and fecal soiling + excessive wiping Known Gr 2 internal hemorrhoids on anoscopy  PROCEDURE NOTE: The patient presents with symptomatic grade 2-3 hemorrhoids, requesting rubber band ligation of his/her hemorrhoidal disease.  All risks, benefits and alternative forms of therapy were described and informed consent was obtained.   The anorectum was pre-medicated with 0.125% NTG and 5% liodcaine The decision was made to band the all 3 internal hemorrhoid columns per his request to do all 3 - understanding increased risk of bleeding and pain, and the [facility]'Regan System was used to perform band ligation without complication.  Digital anorectal examination was then performed to assure proper positioning of the band, and to adjust the banded tissue as required.  The patient was discharged home without pain or other issues.  Dietary and behavioral recommendations were given and along with follow-up instructions.     The following adjunctive treatments were recommended:  Fiber pills 2/day Follow w/ PT - has seen 1 x - "I'm not sure I am oing to go back"  The patient will return as needed for  follow-up and possible additional banding as required. No complications were encountered and the patient tolerated the procedure well.  ZO:[id number]:SCHULTZ,DOUGLAS E, MD

## 2015-12-17 NOTE — Patient Instructions (Addendum)
Please follow up with Dr Leone PayorGessner as needed.  Continue taking 2 fiber tablets daily.  HEMORRHOID BANDING PROCEDURE    FOLLOW-UP CARE   1. The procedure you have had should have been relatively painless since the banding of the area involved does not have nerve endings and there is no pain sensation.  The rubber band cuts off the blood supply to the hemorrhoid and the band may fall off as soon as 48 hours after the banding (the band may occasionally be seen in the toilet bowl following a bowel movement). You may notice a temporary feeling of fullness in the rectum which should respond adequately to plain Tylenol or Motrin.  2. Following the banding, avoid strenuous exercise that evening and resume full activity the next day.  A sitz bath (soaking in a warm tub) or bidet is soothing, and can be useful for cleansing the area after bowel movements.     3. To avoid constipation, take two tablespoons of natural wheat bran, natural oat bran, flax, Benefiber or any over the counter fiber supplement and increase your water intake to 7-8 glasses daily.    4. Unless you have been prescribed anorectal medication, do not put anything inside your rectum for two weeks: No suppositories, enemas, fingers, etc.  5. Occasionally, you may have more bleeding than usual after the banding procedure.  This is often from the untreated hemorrhoids rather than the treated one.  Don't be concerned if there is a tablespoon or so of blood.  If there is more blood than this, lie flat with your bottom higher than your head and apply an ice pack to the area. If the bleeding does not stop within a half an hour or if you feel faint, call our office at [phone] or go to the emergency room.  6. Problems are not common; however, if there is a substantial amount of bleeding, severe pain, chills, fever or difficulty passing urine (very rare) or other problems, you should call us at [phone] or report to the nearest  emergency room.  7. Do not stay seated continuously for more than 2-3 hours for a day or two after the procedure.  Tighten your buttock muscles 10-15 times every two hours and take 10-15 deep breaths every 1-2 hours.  Do not spend more than a few minutes on the toilet if you cannot empty your bowel; instead re-visit the toilet at a later time.   I appreciate the opportunity to care for you. Stan Headarl Gessner, MD, [employer] [facility]

## 2016-01-05 ENCOUNTER — Ambulatory Visit: Payer: BLUE CROSS/BLUE SHIELD | Attending: Internal Medicine | Admitting: Physical Therapy

## 2016-02-05 DIAGNOSIS — G4733 Obstructive sleep apnea (adult) (pediatric): Secondary | ICD-10-CM | POA: Diagnosis not present

## 2016-02-26 ENCOUNTER — Encounter: Payer: Self-pay | Admitting: Pulmonary Disease

## 2016-02-26 ENCOUNTER — Ambulatory Visit (INDEPENDENT_AMBULATORY_CARE_PROVIDER_SITE_OTHER): Payer: BLUE CROSS/BLUE SHIELD | Admitting: Pulmonary Disease

## 2016-02-26 VITALS — BP 120/72 | HR 80 | Ht 65.75 in | Wt 185.6 lb

## 2016-02-26 DIAGNOSIS — F17200 Nicotine dependence, unspecified, uncomplicated: Secondary | ICD-10-CM

## 2016-02-26 DIAGNOSIS — G4733 Obstructive sleep apnea (adult) (pediatric): Secondary | ICD-10-CM | POA: Diagnosis not present

## 2016-02-26 DIAGNOSIS — J449 Chronic obstructive pulmonary disease, unspecified: Secondary | ICD-10-CM | POA: Diagnosis not present

## 2016-02-26 NOTE — Assessment & Plan Note (Signed)
BiPAP is working well Supplies will be renewed for 1 year  Weight loss encouraged, compliance with goal of at least 4-6 hrs every night is the expectation. Advised against medications with sedative side effects Cautioned against driving when sleepy - understanding that sleepiness will vary on a day to day basis

## 2016-02-26 NOTE — Patient Instructions (Signed)
BiPAP is working well Supplies will be renewed for 1 year  You have to stop smoking! We will refer you to our lung cancer screening program

## 2016-02-26 NOTE — Assessment & Plan Note (Signed)
You have to stop smoking! We will refer you to our lung cancer screening program

## 2016-02-26 NOTE — Progress Notes (Signed)
   Subjective:    Patient ID: Jared Bryan, male    DOB: 30-Mar-1955, 61 y.o.   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  02/26/2016   Chief Complaint  Patient presents with  . Follow-up    4 month follow up. Breathing has been the same last visit.    He has cut down on his smoking to half pack per day and attributes this to stress on his job. His breathing is no worse than baseline and he denies wheezing  Due to stress on his job, he has not been able to sleep well He is doing well on his BiPAP machine and reports good improvement in his daytime somnolence and snoring and increased energy in general Download on 12/8 confirms good usage more than 6 hours per night with mild leak and good control of events  He just got a new mask and this seems to be working well    Significant tests/ events 07/2015 Spirometry - FEV1 of 68% with a ratio of 72 and FVC of 77% small airways were decreased at 45%  PSG 08/2015 AHI 57/h, corrected by BiPAP 12/8   Review of Systems neg for any significant sore throat, dysphagia, itching, sneezing, nasal congestion or excess/ purulent secretions, fever, chills, sweats, unintended wt loss, pleuritic or exertional cp, hempoptysis, orthopnea pnd or change in chronic leg swelling. Also denies presyncope, palpitations, heartburn, abdominal pain, nausea, vomiting, diarrhea or change in bowel or urinary habits, dysuria,hematuria, rash, arthralgias, visual complaints, headache, numbness weakness or ataxia.     Objective:   Physical Exam  Gen. Pleasant, obese, in no distress ENT - no lesions, no post nasal drip Neck: No JVD, no thyromegaly, no carotid bruits Lungs: no use of accessory muscles, no dullness to percussion, decreased without rales or rhonchi  Cardiovascular: Rhythm regular, heart sounds  normal, no  murmurs or gallops, no peripheral edema Musculoskeletal: No deformities, no cyanosis or clubbing , no tremors       Assessment & Plan:

## 2016-03-03 ENCOUNTER — Encounter: Payer: Self-pay | Admitting: Pulmonary Disease

## 2016-03-19 ENCOUNTER — Other Ambulatory Visit: Payer: Self-pay | Admitting: Acute Care

## 2016-03-19 DIAGNOSIS — F17210 Nicotine dependence, cigarettes, uncomplicated: Secondary | ICD-10-CM

## 2016-03-29 ENCOUNTER — Encounter: Payer: BLUE CROSS/BLUE SHIELD | Admitting: Acute Care

## 2016-03-29 ENCOUNTER — Inpatient Hospital Stay: Admission: RE | Admit: 2016-03-29 | Payer: BLUE CROSS/BLUE SHIELD | Source: Ambulatory Visit

## 2016-03-30 ENCOUNTER — Telehealth: Payer: Self-pay | Admitting: Acute Care

## 2016-03-30 DIAGNOSIS — F17210 Nicotine dependence, cigarettes, uncomplicated: Secondary | ICD-10-CM

## 2016-03-30 NOTE — Telephone Encounter (Signed)
Spoke with pt to reschedule [facility] anc CT.  Pt states he is real busy with work right now and would like us to check back with him in 2 weeks.  Will call pt back to rescheduled.

## 2016-04-15 NOTE — Telephone Encounter (Signed)
[facility] with pt [facility]30 [facility] ordered Nothing further needed

## 2016-04-28 ENCOUNTER — Telehealth: Payer: Self-pay | Admitting: Pulmonary Disease

## 2016-04-28 NOTE — Telephone Encounter (Signed)
FYI for Dr Vassie LollAlva - Pt cancelled his appt for shared decision and CT.  I spoke with pt to reschedule and he states that he does not want to participate in the program.  He states that if he does have cancer he doesn't want to know or get treatments.  Referral has been cancelled.

## 2016-04-28 NOTE — Telephone Encounter (Signed)
ok 

## 2016-04-29 ENCOUNTER — Ambulatory Visit: Payer: BLUE CROSS/BLUE SHIELD | Admitting: Internal Medicine

## 2016-05-05 ENCOUNTER — Inpatient Hospital Stay: Admission: RE | Admit: 2016-05-05 | Payer: BLUE CROSS/BLUE SHIELD | Source: Ambulatory Visit

## 2016-05-05 ENCOUNTER — Encounter: Payer: BLUE CROSS/BLUE SHIELD | Admitting: Acute Care

## 2016-05-07 ENCOUNTER — Inpatient Hospital Stay: Admission: RE | Admit: 2016-05-07 | Payer: BLUE CROSS/BLUE SHIELD | Source: Ambulatory Visit

## 2016-05-07 ENCOUNTER — Encounter: Payer: BLUE CROSS/BLUE SHIELD | Admitting: Acute Care

## 2016-05-13 DIAGNOSIS — K623 Rectal prolapse: Secondary | ICD-10-CM | POA: Diagnosis not present

## 2016-05-20 DIAGNOSIS — E785 Hyperlipidemia, unspecified: Secondary | ICD-10-CM | POA: Diagnosis not present

## 2016-05-20 DIAGNOSIS — D519 Vitamin B12 deficiency anemia, unspecified: Secondary | ICD-10-CM | POA: Diagnosis not present

## 2016-05-20 DIAGNOSIS — R7301 Impaired fasting glucose: Secondary | ICD-10-CM | POA: Diagnosis not present

## 2016-05-20 DIAGNOSIS — Z0000 Encounter for general adult medical examination without abnormal findings: Secondary | ICD-10-CM | POA: Diagnosis not present

## 2016-05-20 DIAGNOSIS — I10 Essential (primary) hypertension: Secondary | ICD-10-CM | POA: Diagnosis not present

## 2016-06-25 DIAGNOSIS — E785 Hyperlipidemia, unspecified: Secondary | ICD-10-CM | POA: Diagnosis not present

## 2016-06-25 DIAGNOSIS — J449 Chronic obstructive pulmonary disease, unspecified: Secondary | ICD-10-CM | POA: Diagnosis not present

## 2016-06-25 DIAGNOSIS — G4733 Obstructive sleep apnea (adult) (pediatric): Secondary | ICD-10-CM | POA: Diagnosis not present

## 2016-06-25 DIAGNOSIS — R159 Full incontinence of feces: Secondary | ICD-10-CM | POA: Diagnosis not present

## 2016-07-08 DIAGNOSIS — R159 Full incontinence of feces: Secondary | ICD-10-CM | POA: Diagnosis not present

## 2016-09-09 DIAGNOSIS — K623 Rectal prolapse: Secondary | ICD-10-CM | POA: Diagnosis not present

## 2016-09-09 DIAGNOSIS — Z6830 Body mass index (BMI) 30.0-30.9, adult: Secondary | ICD-10-CM | POA: Diagnosis not present

## 2016-09-14 DIAGNOSIS — I10 Essential (primary) hypertension: Secondary | ICD-10-CM | POA: Diagnosis not present

## 2016-09-14 DIAGNOSIS — K623 Rectal prolapse: Secondary | ICD-10-CM | POA: Diagnosis not present

## 2016-09-14 DIAGNOSIS — J449 Chronic obstructive pulmonary disease, unspecified: Secondary | ICD-10-CM | POA: Diagnosis not present

## 2016-09-14 DIAGNOSIS — K6289 Other specified diseases of anus and rectum: Secondary | ICD-10-CM | POA: Diagnosis not present

## 2016-10-09 DIAGNOSIS — M722 Plantar fascial fibromatosis: Secondary | ICD-10-CM | POA: Diagnosis not present

## 2016-11-12 DIAGNOSIS — G4733 Obstructive sleep apnea (adult) (pediatric): Secondary | ICD-10-CM | POA: Diagnosis not present

## 2016-11-16 DIAGNOSIS — K642 Third degree hemorrhoids: Secondary | ICD-10-CM | POA: Diagnosis not present

## 2016-11-18 DIAGNOSIS — M25512 Pain in left shoulder: Secondary | ICD-10-CM | POA: Diagnosis not present

## 2016-11-18 DIAGNOSIS — M546 Pain in thoracic spine: Secondary | ICD-10-CM | POA: Diagnosis not present

## 2016-11-24 DIAGNOSIS — F17200 Nicotine dependence, unspecified, uncomplicated: Secondary | ICD-10-CM | POA: Diagnosis not present

## 2016-11-24 DIAGNOSIS — I10 Essential (primary) hypertension: Secondary | ICD-10-CM | POA: Diagnosis not present

## 2016-11-24 DIAGNOSIS — D519 Vitamin B12 deficiency anemia, unspecified: Secondary | ICD-10-CM | POA: Diagnosis not present

## 2016-11-24 DIAGNOSIS — E785 Hyperlipidemia, unspecified: Secondary | ICD-10-CM | POA: Diagnosis not present

## 2016-11-24 DIAGNOSIS — R7301 Impaired fasting glucose: Secondary | ICD-10-CM | POA: Diagnosis not present

## 2016-11-30 DIAGNOSIS — K6289 Other specified diseases of anus and rectum: Secondary | ICD-10-CM | POA: Diagnosis not present

## 2016-11-30 DIAGNOSIS — K642 Third degree hemorrhoids: Secondary | ICD-10-CM | POA: Diagnosis not present

## 2016-11-30 DIAGNOSIS — I10 Essential (primary) hypertension: Secondary | ICD-10-CM | POA: Diagnosis not present

## 2016-11-30 DIAGNOSIS — F17200 Nicotine dependence, unspecified, uncomplicated: Secondary | ICD-10-CM | POA: Diagnosis not present

## 2016-11-30 DIAGNOSIS — J449 Chronic obstructive pulmonary disease, unspecified: Secondary | ICD-10-CM | POA: Diagnosis not present

## 2016-11-30 DIAGNOSIS — Z6831 Body mass index (BMI) 31.0-31.9, adult: Secondary | ICD-10-CM | POA: Diagnosis not present

## 2016-12-02 DIAGNOSIS — J449 Chronic obstructive pulmonary disease, unspecified: Secondary | ICD-10-CM | POA: Diagnosis not present

## 2016-12-02 DIAGNOSIS — I10 Essential (primary) hypertension: Secondary | ICD-10-CM | POA: Diagnosis not present

## 2016-12-09 DIAGNOSIS — R030 Elevated blood-pressure reading, without diagnosis of hypertension: Secondary | ICD-10-CM | POA: Diagnosis not present

## 2016-12-09 DIAGNOSIS — Z6833 Body mass index (BMI) 33.0-33.9, adult: Secondary | ICD-10-CM | POA: Diagnosis not present

## 2016-12-09 DIAGNOSIS — M4802 Spinal stenosis, cervical region: Secondary | ICD-10-CM | POA: Diagnosis not present

## 2016-12-13 DIAGNOSIS — K642 Third degree hemorrhoids: Secondary | ICD-10-CM | POA: Diagnosis not present

## 2016-12-13 DIAGNOSIS — K648 Other hemorrhoids: Secondary | ICD-10-CM | POA: Diagnosis not present

## 2016-12-13 DIAGNOSIS — K649 Unspecified hemorrhoids: Secondary | ICD-10-CM | POA: Diagnosis not present

## 2016-12-13 DIAGNOSIS — J449 Chronic obstructive pulmonary disease, unspecified: Secondary | ICD-10-CM | POA: Diagnosis not present

## 2017-01-04 DIAGNOSIS — M542 Cervicalgia: Secondary | ICD-10-CM | POA: Diagnosis not present

## 2017-01-04 DIAGNOSIS — M4722 Other spondylosis with radiculopathy, cervical region: Secondary | ICD-10-CM | POA: Diagnosis not present

## 2017-01-04 DIAGNOSIS — I10 Essential (primary) hypertension: Secondary | ICD-10-CM | POA: Diagnosis not present

## 2017-01-04 DIAGNOSIS — M722 Plantar fascial fibromatosis: Secondary | ICD-10-CM | POA: Diagnosis not present

## 2017-01-18 ENCOUNTER — Ambulatory Visit: Payer: BLUE CROSS/BLUE SHIELD | Admitting: Podiatry

## 2017-01-18 ENCOUNTER — Ambulatory Visit (INDEPENDENT_AMBULATORY_CARE_PROVIDER_SITE_OTHER): Payer: BLUE CROSS/BLUE SHIELD

## 2017-01-18 DIAGNOSIS — M79671 Pain in right foot: Secondary | ICD-10-CM

## 2017-01-18 DIAGNOSIS — M722 Plantar fascial fibromatosis: Secondary | ICD-10-CM | POA: Diagnosis not present

## 2017-01-18 NOTE — Patient Instructions (Signed)

## 2017-01-19 DIAGNOSIS — M542 Cervicalgia: Secondary | ICD-10-CM | POA: Diagnosis not present

## 2017-01-19 DIAGNOSIS — M4722 Other spondylosis with radiculopathy, cervical region: Secondary | ICD-10-CM | POA: Diagnosis not present

## 2017-01-20 DIAGNOSIS — M4722 Other spondylosis with radiculopathy, cervical region: Secondary | ICD-10-CM | POA: Diagnosis not present

## 2017-01-31 NOTE — Progress Notes (Signed)
Subjective:  Patient ID: Jared Bryan, male    DOB: [birth year],  MRN: [id number]  Chief Complaint  Patient presents with  . Plantar Fasciitis    right heel pain    61 y.o. male presents with the above complaint.  Ports right heel pain for about 2 months.  Dr. Fabio AsaScholz is trying to treat it for 2 months without relief.  Reports pain worse in the a.m. Past Medical History:  Diagnosis Date  . Anxiety   . Arthritis   . Closed fracture of dorsal (thoracic) vertebra without mention of spinal cord injury   . Colon polyps   . COPD (chronic obstructive pulmonary disease) (HCC)   . Depression   . Dizziness and giddiness   . Emphysema lung (HCC)   . GERD (gastroesophageal reflux disease)   . Hyperlipidemia   . Multiple gastric ulcers   . Other specified disease of hair and hair follicles   . Sleep apnea   . Thoracic spine fracture [facility](HCC)    Past Surgical History:  Procedure Laterality Date  . ANAL RECTAL MANOMETRY N/A 11/19/2015   Procedure: ANO RECTAL MANOMETRY;  Surgeon: Iva Booparl E Gessner, MD;  Location: [facility];  Service: Endoscopy;  Laterality: N/A;  . APPENDECTOMY    . CERVICAL FUSION    . INGUINAL HERNIA REPAIR Left    with mesh  . LEFT HEART CATHETERIZATION WITH CORONARY ANGIOGRAM N/A 10/11/2013   Procedure: LEFT HEART CATHETERIZATION WITH CORONARY ANGIOGRAM;  Surgeon: Wendall StadePeter C Nishan, MD;  [facility];  Service: Cardiovascular;  Laterality: N/A;    Current Outpatient Medications:  .  acetaminophen (TYLENOL) 500 MG tablet, Take 500 mg by mouth every 6 (six) hours as needed for mild pain., Disp: , Rfl:  .  ALPRAZolam (XANAX) 0.25 MG tablet, Take 0.25 mg by mouth at bedtime as needed for anxiety., Disp: , Rfl:  .  amLODipine (NORVASC) 5 MG tablet, Take 5 mg by mouth daily., Disp: , Rfl:  .  cetirizine (ZYRTEC) 10 MG tablet, Take 10 mg by mouth daily as needed for allergies., Disp: , Rfl:  .  nitroGLYCERIN (NITROSTAT) 0.4 MG SL tablet, Place 1 tablet (0.4 mg total) under  the tongue every 5 (five) minutes as needed for chest pain., Disp: 25 tablet, Rfl: 1 .  pantoprazole (PROTONIX) 40 MG tablet, Take 40 mg by mouth daily., Disp: , Rfl:  .  PARoxetine (PAXIL) 20 MG tablet, Take 20 mg by mouth daily. *will take 10-20 mg once daily depending on how patient feels*, Disp: , Rfl:   Allergies  Allergen Reactions  . Niacin And Related     Flushing feeling   Review of Systems Objective:  There were no vitals filed for this visit. General AA&O x3. Normal mood and affect.  Vascular Dorsalis pedis and posterior tibial pulses  present 2+ bilaterally  Capillary refill normal to all digits. Pedal hair growth normal.  Neurologic Epicritic sensation grossly present.  Dermatologic No open lesions. Interspaces clear of maceration. Nails well groomed and normal in appearance.  Orthopedic: MMT 5/5 in dorsiflexion, plantarflexion, inversion, and eversion. Normal joint ROM without pain or crepitus. Pain to palpation right medial heel tuber  Graft taken reviewed no acute fractures dislocations Assessment & Plan:  Patient was evaluated and treated and all questions answered.  Plantar Fasciitis, right - XR reviewed as above.  - Educated on icing and stretching. Instructions given.  - Injection delivered to the plantar fascia as below. - Night splint dispensed.  Procedure: Injection  Tendon/Ligament Location: Right plantar fascia at the glabrous junction; medial approach. Skin Prep: Alcohol. Injectate: 1 cc 0.5% marcaine plain, 1 cc dexamethasone phosphate, 0.5 cc kenalog 10. Disposition: Patient tolerated procedure well. Injection site dressed with a band-aid.  Return in about 3 weeks (around 02/08/2017).

## 2017-02-11 DIAGNOSIS — M5412 Radiculopathy, cervical region: Secondary | ICD-10-CM | POA: Diagnosis not present

## 2017-02-11 DIAGNOSIS — M9981 Other biomechanical lesions of cervical region: Secondary | ICD-10-CM | POA: Diagnosis not present

## 2017-02-11 DIAGNOSIS — R200 Anesthesia of skin: Secondary | ICD-10-CM | POA: Diagnosis not present

## 2017-02-14 ENCOUNTER — Ambulatory Visit: Payer: BLUE CROSS/BLUE SHIELD | Admitting: Podiatry

## 2017-02-14 ENCOUNTER — Encounter: Payer: Self-pay | Admitting: Podiatry

## 2017-02-14 DIAGNOSIS — M722 Plantar fascial fibromatosis: Secondary | ICD-10-CM

## 2017-02-14 DIAGNOSIS — G540 Brachial plexus disorders: Secondary | ICD-10-CM | POA: Diagnosis not present

## 2017-02-14 DIAGNOSIS — Z6833 Body mass index (BMI) 33.0-33.9, adult: Secondary | ICD-10-CM | POA: Diagnosis not present

## 2017-02-14 DIAGNOSIS — I10 Essential (primary) hypertension: Secondary | ICD-10-CM | POA: Diagnosis not present

## 2017-02-14 NOTE — Progress Notes (Signed)
  Subjective:  Patient ID: Jared Bryan, male    DOB: December 15, 1955,  MRN: [id number]  Chief Complaint  Patient presents with  . Foot Pain    fu pf right still sore did see some improvment    62 y.o. male returns for the above complaint. Reports some improvement, still having pain in the center part of his heel.  Objective:  There were no vitals filed for this visit. General AA&O x3. Normal mood and affect.  Vascular Pedal pulses palpable.  Neurologic Epicritic sensation grossly intact.  Dermatologic No open lesions. Skin normal texture and turgor.  Orthopedic: Pain to palpation central plantar calcaneus.   Assessment & Plan:  Patient was evaluated and treated and all questions answered.  Plantar Fasciitis, R -Injection #2 as below. -Pads dispensed.  Procedure: Injection Tendon/Ligament Consent: Verbal consent obrained. Location: Right plantar fascia at the glabrous junction; medial approach. Skin Prep: Alcohol. Injectate: 1 cc 0.5% marcaine plain, 1 cc dexamethasone phosphate, 0.5 cc kenalog 10. Disposition: Patient tolerated procedure well. Injection site dressed with a band-aid.     Return in about 3 weeks (around 03/07/2017) for Plantar fasciitis.

## 2017-02-21 DIAGNOSIS — G540 Brachial plexus disorders: Secondary | ICD-10-CM | POA: Diagnosis not present

## 2017-02-21 DIAGNOSIS — M4712 Other spondylosis with myelopathy, cervical region: Secondary | ICD-10-CM | POA: Diagnosis not present

## 2017-02-24 DIAGNOSIS — M4712 Other spondylosis with myelopathy, cervical region: Secondary | ICD-10-CM | POA: Diagnosis not present

## 2017-02-24 DIAGNOSIS — G540 Brachial plexus disorders: Secondary | ICD-10-CM | POA: Diagnosis not present

## 2017-02-28 ENCOUNTER — Encounter: Payer: Self-pay | Admitting: Pulmonary Disease

## 2017-02-28 ENCOUNTER — Ambulatory Visit: Payer: BLUE CROSS/BLUE SHIELD | Admitting: Pulmonary Disease

## 2017-02-28 DIAGNOSIS — G540 Brachial plexus disorders: Secondary | ICD-10-CM | POA: Diagnosis not present

## 2017-02-28 DIAGNOSIS — J449 Chronic obstructive pulmonary disease, unspecified: Secondary | ICD-10-CM | POA: Diagnosis not present

## 2017-02-28 DIAGNOSIS — G4733 Obstructive sleep apnea (adult) (pediatric): Secondary | ICD-10-CM | POA: Diagnosis not present

## 2017-02-28 DIAGNOSIS — F17200 Nicotine dependence, unspecified, uncomplicated: Secondary | ICD-10-CM | POA: Diagnosis not present

## 2017-02-28 DIAGNOSIS — M4712 Other spondylosis with myelopathy, cervical region: Secondary | ICD-10-CM | POA: Diagnosis not present

## 2017-02-28 NOTE — Assessment & Plan Note (Signed)
Bilevel current settings 12/8 seems to be working well.  He has good improvement in daytime somnolence and fatigue. No problems with mask or pressure  Weight loss encouraged, compliance with goal of at least 4-6 hrs every night is the expectation. Advised against medications with sedative side effects Cautioned against driving when sleepy - understanding that sleepiness will vary on a day to day basis

## 2017-02-28 NOTE — Patient Instructions (Signed)
  Spirometry today. BiPAP is working well.  Again we discussed smoking cessation.  This will make you live longer.  Referral for lung cancer screening CT scan

## 2017-02-28 NOTE — Assessment & Plan Note (Signed)
Spirometry today.  Referral for lung cancer screening CT scan  I was able to convince him after a long conversation

## 2017-02-28 NOTE — Progress Notes (Signed)
   Subjective:    Patient ID: Jared Bryan, male    DOB: 1955-06-16, 62 y.o.   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   He continues to smoke a pack per day.  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  He still gets up on the roof.  Denies shortness of breath or wheezing or bad chest cold. He was referred to lung cancer screening program but did not keep the appointment for CT scan because "I do not want to find out"   His BiPAP is working well, wife corroborates that history.  No snoring noted, sleeps well when he does sleep but is only able to sleep for 5-6 hours every night. No problems with mask or pressure. Download was reviewed which shows excellent control of events on bilevel 12/8 with moderate leak about 50% of the time.  But he states that this leak does not bother him much.  No dryness  Spirometry today surprisingly shows ratio of 81 and FEV1 of 77% indicating mild restriction  Significant tests/ events 07/2015 Spirometry -FEV1 of 68% with a ratio of 72 and FVC of 77% small airways were decreased at 45%  PSG 08/2015 AHI 57/h, corrected by BiPAP 12/8   Past Medical History:  Diagnosis Date  . Anxiety   . Arthritis   . Closed fracture of dorsal (thoracic) vertebra without mention of spinal cord injury   . Colon polyps   . COPD (chronic obstructive pulmonary disease) (HCC)   . Depression   . Dizziness and giddiness   . Emphysema lung (HCC)   . GERD (gastroesophageal reflux disease)   . Hyperlipidemia   . Multiple gastric ulcers   . Other specified disease of hair and hair follicles   . Sleep apnea   . Thoracic spine fracture (HCC)      Review of Systems neg for any significant sore throat, dysphagia, itching, sneezing, nasal congestion or excess/ purulent secretions, fever, chills, sweats, unintended wt loss,  pleuritic or exertional cp, hempoptysis, orthopnea pnd or change in chronic leg swelling. Also denies presyncope, palpitations, heartburn, abdominal pain, nausea, vomiting, diarrhea or change in bowel or urinary habits, dysuria,hematuria, rash, arthralgias, visual complaints, headache, numbness weakness or ataxia.     Objective:   Physical Exam  Gen. Pleasant, obese, in no distress ENT - no lesions, no post nasal drip Neck: No JVD, no thyromegaly, no carotid bruits Lungs: no use of accessory muscles, no dullness to percussion, decreased without rales or rhonchi  Cardiovascular: Rhythm regular, heart sounds  normal, no murmurs or gallops, no peripheral edema Musculoskeletal: No deformities, no cyanosis or clubbing , no tremors        Assessment & Plan:

## 2017-02-28 NOTE — Assessment & Plan Note (Signed)
Again we discussed smoking cessation.  This will make you live longer

## 2017-02-28 NOTE — Addendum Note (Signed)
Addended by: Maurene CapesPOTTS, Grete Bosko M on: 02/28/2017 01:22 PM   Modules accepted: Orders

## 2017-03-07 ENCOUNTER — Ambulatory Visit: Payer: BLUE CROSS/BLUE SHIELD | Admitting: Podiatry

## 2017-03-07 ENCOUNTER — Telehealth: Payer: Self-pay | Admitting: Podiatry

## 2017-03-07 NOTE — Telephone Encounter (Signed)
Pt cxled his appt for this afternoon states feeling so much better. He said Dr Samuella CotaPrice did a wonderful job with the 2 shots he gave him and to thank him.

## 2017-03-07 NOTE — Telephone Encounter (Signed)
Thank you :)

## 2017-03-14 DIAGNOSIS — G540 Brachial plexus disorders: Secondary | ICD-10-CM | POA: Diagnosis not present

## 2017-03-28 ENCOUNTER — Telehealth: Payer: Self-pay | Admitting: Pulmonary Disease

## 2017-03-28 NOTE — Telephone Encounter (Signed)
ok 

## 2017-03-28 NOTE — Telephone Encounter (Signed)
FYI for Dr Vassie LollAlva - Pt has decided not to participate in lung cancer screening at this time.  Referral has been cancelled.

## 2017-04-05 DIAGNOSIS — M25551 Pain in right hip: Secondary | ICD-10-CM | POA: Diagnosis not present

## 2017-04-05 DIAGNOSIS — M25561 Pain in right knee: Secondary | ICD-10-CM | POA: Diagnosis not present

## 2017-04-05 DIAGNOSIS — M48061 Spinal stenosis, lumbar region without neurogenic claudication: Secondary | ICD-10-CM | POA: Diagnosis not present

## 2017-04-05 DIAGNOSIS — Z6831 Body mass index (BMI) 31.0-31.9, adult: Secondary | ICD-10-CM | POA: Diagnosis not present

## 2017-04-05 DIAGNOSIS — M5416 Radiculopathy, lumbar region: Secondary | ICD-10-CM | POA: Diagnosis not present

## 2017-04-05 DIAGNOSIS — M1611 Unilateral primary osteoarthritis, right hip: Secondary | ICD-10-CM | POA: Diagnosis not present

## 2017-04-12 ENCOUNTER — Other Ambulatory Visit: Payer: Self-pay | Admitting: Podiatry

## 2017-04-12 DIAGNOSIS — M1711 Unilateral primary osteoarthritis, right knee: Secondary | ICD-10-CM | POA: Diagnosis not present

## 2017-04-12 DIAGNOSIS — M25361 Other instability, right knee: Secondary | ICD-10-CM | POA: Diagnosis not present

## 2017-04-12 DIAGNOSIS — G8929 Other chronic pain: Secondary | ICD-10-CM | POA: Diagnosis not present

## 2017-04-12 DIAGNOSIS — M79671 Pain in right foot: Secondary | ICD-10-CM

## 2017-04-12 DIAGNOSIS — M722 Plantar fascial fibromatosis: Secondary | ICD-10-CM

## 2017-04-12 DIAGNOSIS — M25561 Pain in right knee: Secondary | ICD-10-CM | POA: Diagnosis not present

## 2017-05-03 ENCOUNTER — Other Ambulatory Visit: Payer: Self-pay

## 2017-05-03 DIAGNOSIS — R202 Paresthesia of skin: Secondary | ICD-10-CM

## 2017-05-03 DIAGNOSIS — G540 Brachial plexus disorders: Secondary | ICD-10-CM

## 2017-05-03 DIAGNOSIS — M4802 Spinal stenosis, cervical region: Secondary | ICD-10-CM

## 2017-05-31 DIAGNOSIS — Z0000 Encounter for general adult medical examination without abnormal findings: Secondary | ICD-10-CM | POA: Diagnosis not present

## 2017-05-31 DIAGNOSIS — F419 Anxiety disorder, unspecified: Secondary | ICD-10-CM | POA: Diagnosis not present

## 2017-05-31 DIAGNOSIS — E785 Hyperlipidemia, unspecified: Secondary | ICD-10-CM | POA: Diagnosis not present

## 2017-05-31 DIAGNOSIS — R7301 Impaired fasting glucose: Secondary | ICD-10-CM | POA: Diagnosis not present

## 2017-05-31 DIAGNOSIS — I10 Essential (primary) hypertension: Secondary | ICD-10-CM | POA: Diagnosis not present

## 2017-05-31 DIAGNOSIS — D519 Vitamin B12 deficiency anemia, unspecified: Secondary | ICD-10-CM | POA: Diagnosis not present

## 2017-05-31 DIAGNOSIS — F17200 Nicotine dependence, unspecified, uncomplicated: Secondary | ICD-10-CM | POA: Diagnosis not present

## 2017-06-17 ENCOUNTER — Encounter: Payer: Self-pay | Admitting: Vascular Surgery

## 2017-06-17 ENCOUNTER — Ambulatory Visit (INDEPENDENT_AMBULATORY_CARE_PROVIDER_SITE_OTHER): Payer: BLUE CROSS/BLUE SHIELD | Admitting: Vascular Surgery

## 2017-06-17 ENCOUNTER — Other Ambulatory Visit: Payer: Self-pay

## 2017-06-17 ENCOUNTER — Ambulatory Visit (HOSPITAL_COMMUNITY)
Admission: RE | Admit: 2017-06-17 | Discharge: 2017-06-17 | Disposition: A | Discharge status: Home / Self Care | Payer: BLUE CROSS/BLUE SHIELD | Source: Ambulatory Visit | Attending: Vascular Surgery | Admitting: Vascular Surgery

## 2017-06-17 ENCOUNTER — Encounter

## 2017-06-17 VITALS — BP 138/80 | HR 63 | Resp 20 | Ht 65.75 in | Wt 187.0 lb

## 2017-06-17 DIAGNOSIS — G540 Brachial plexus disorders: Secondary | ICD-10-CM | POA: Insufficient documentation

## 2017-06-17 DIAGNOSIS — M4802 Spinal stenosis, cervical region: Secondary | ICD-10-CM

## 2017-06-17 DIAGNOSIS — R202 Paresthesia of skin: Secondary | ICD-10-CM | POA: Diagnosis not present

## 2017-06-17 DIAGNOSIS — M9981 Other biomechanical lesions of cervical region: Secondary | ICD-10-CM | POA: Insufficient documentation
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
## 2017-06-22 ENCOUNTER — Encounter: Payer: Self-pay | Admitting: Neurosurgery

## 2017-07-04 DIAGNOSIS — R600 Localized edema: Secondary | ICD-10-CM | POA: Diagnosis not present

## 2017-07-04 DIAGNOSIS — M7989 Other specified soft tissue disorders: Secondary | ICD-10-CM | POA: Diagnosis not present

## 2017-07-08 DIAGNOSIS — G540 Brachial plexus disorders: Secondary | ICD-10-CM | POA: Diagnosis not present

## 2017-07-19 DIAGNOSIS — G540 Brachial plexus disorders: Secondary | ICD-10-CM | POA: Diagnosis not present

## 2017-07-20 DIAGNOSIS — G540 Brachial plexus disorders: Secondary | ICD-10-CM | POA: Diagnosis not present

## 2017-07-22 DIAGNOSIS — G540 Brachial plexus disorders: Secondary | ICD-10-CM | POA: Diagnosis not present

## 2017-07-25 DIAGNOSIS — H2513 Age-related nuclear cataract, bilateral: Secondary | ICD-10-CM | POA: Diagnosis not present

## 2017-07-25 DIAGNOSIS — G540 Brachial plexus disorders: Secondary | ICD-10-CM | POA: Diagnosis not present

## 2017-07-27 DIAGNOSIS — G540 Brachial plexus disorders: Secondary | ICD-10-CM | POA: Diagnosis not present

## 2017-07-29 DIAGNOSIS — G540 Brachial plexus disorders: Secondary | ICD-10-CM | POA: Diagnosis not present

## 2017-08-01 DIAGNOSIS — G540 Brachial plexus disorders: Secondary | ICD-10-CM | POA: Diagnosis not present

## 2017-08-03 DIAGNOSIS — G540 Brachial plexus disorders: Secondary | ICD-10-CM | POA: Diagnosis not present

## 2017-08-05 DIAGNOSIS — G540 Brachial plexus disorders: Secondary | ICD-10-CM | POA: Diagnosis not present

## 2017-08-08 DIAGNOSIS — G540 Brachial plexus disorders: Secondary | ICD-10-CM | POA: Diagnosis not present

## 2017-08-10 DIAGNOSIS — G540 Brachial plexus disorders: Secondary | ICD-10-CM | POA: Diagnosis not present

## 2017-08-12 DIAGNOSIS — G540 Brachial plexus disorders: Secondary | ICD-10-CM | POA: Diagnosis not present

## 2017-08-15 DIAGNOSIS — G540 Brachial plexus disorders: Secondary | ICD-10-CM | POA: Diagnosis not present

## 2017-08-17 DIAGNOSIS — G540 Brachial plexus disorders: Secondary | ICD-10-CM | POA: Diagnosis not present

## 2017-08-18 DIAGNOSIS — G4733 Obstructive sleep apnea (adult) (pediatric): Secondary | ICD-10-CM | POA: Diagnosis not present

## 2017-08-19 DIAGNOSIS — G540 Brachial plexus disorders: Secondary | ICD-10-CM | POA: Diagnosis not present

## 2017-08-22 DIAGNOSIS — G540 Brachial plexus disorders: Secondary | ICD-10-CM | POA: Diagnosis not present

## 2017-08-24 DIAGNOSIS — G540 Brachial plexus disorders: Secondary | ICD-10-CM | POA: Diagnosis not present

## 2017-08-26 DIAGNOSIS — G540 Brachial plexus disorders: Secondary | ICD-10-CM | POA: Diagnosis not present

## 2017-08-31 DIAGNOSIS — G540 Brachial plexus disorders: Secondary | ICD-10-CM | POA: Diagnosis not present

## 2017-12-02 DIAGNOSIS — G4733 Obstructive sleep apnea (adult) (pediatric): Secondary | ICD-10-CM | POA: Diagnosis not present

## 2017-12-08 DIAGNOSIS — D519 Vitamin B12 deficiency anemia, unspecified: Secondary | ICD-10-CM | POA: Diagnosis not present

## 2017-12-08 DIAGNOSIS — E785 Hyperlipidemia, unspecified: Secondary | ICD-10-CM | POA: Diagnosis not present

## 2017-12-08 DIAGNOSIS — F17200 Nicotine dependence, unspecified, uncomplicated: Secondary | ICD-10-CM | POA: Diagnosis not present

## 2017-12-08 DIAGNOSIS — R7301 Impaired fasting glucose: Secondary | ICD-10-CM | POA: Diagnosis not present

## 2017-12-08 DIAGNOSIS — I10 Essential (primary) hypertension: Secondary | ICD-10-CM | POA: Diagnosis not present

## 2017-12-31 DIAGNOSIS — J019 Acute sinusitis, unspecified: Secondary | ICD-10-CM | POA: Diagnosis not present

## 2017-12-31 DIAGNOSIS — R0981 Nasal congestion: Secondary | ICD-10-CM | POA: Diagnosis not present
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
## 2018-01-09 ENCOUNTER — Encounter (INDEPENDENT_AMBULATORY_CARE_PROVIDER_SITE_OTHER): Payer: Self-pay

## 2018-01-09 ENCOUNTER — Ambulatory Visit: Payer: BLUE CROSS/BLUE SHIELD | Admitting: Cardiovascular Disease

## 2018-01-09 ENCOUNTER — Encounter: Payer: Self-pay | Admitting: Cardiovascular Disease

## 2018-01-09 VITALS — Ht 65.75 in

## 2018-01-09 DIAGNOSIS — G540 Brachial plexus disorders: Secondary | ICD-10-CM | POA: Diagnosis not present

## 2018-01-09 DIAGNOSIS — R0789 Other chest pain: Secondary | ICD-10-CM | POA: Diagnosis not present

## 2018-01-09 DIAGNOSIS — R079 Chest pain, unspecified: Secondary | ICD-10-CM

## 2018-01-09 DIAGNOSIS — R202 Paresthesia of skin: Secondary | ICD-10-CM

## 2018-01-09 DIAGNOSIS — M4802 Spinal stenosis, cervical region: Secondary | ICD-10-CM

## 2018-01-09 NOTE — Patient Instructions (Signed)
Medication Instructions:  None  If you need a refill on your cardiac medications before your next appointment, please call your pharmacy.   Lab work:none  If you have labs (blood work) drawn today and your tests are completely normal, you will receive your results only by: [name]. MyChart Message (if you have MyChart) OR . A paper copy in the mail If you have any lab test that is abnormal or we need to change your treatment, we will call you to review the results.  Testing/Procedures: Steffanie DunnLexiscan myoview and echo  Follow-Up: At [facility], you and your health needs are our priority.  As part of our continuing mission to provide you with exceptional heart care, we have created designated Provider Care Teams.  These Care Teams include your primary Cardiologist (physician) and Advanced Practice Providers (APPs -  Physician Assistants and Nurse Practitioners) who all work together to provide you with the care you need, when you need it. As needed  Any Other Special Instructions Will Be Listed Below (If Applicable).

## 2018-01-12 ENCOUNTER — Telehealth (HOSPITAL_COMMUNITY): Payer: Self-pay | Admitting: *Deleted

## 2018-01-12 NOTE — Telephone Encounter (Signed)
Patient given detailed instructions per Myocardial Perfusion Study Information Sheet for the test on 01/17/18. Patient notified to arrive 15 minutes early and that it is imperative to arrive on time for appointment to keep from having the test rescheduled.  If you need to cancel or reschedule your appointment, please call the office within 24 hours of your appointment. . Patient verbalized understanding.Jared Bryan     

## 2018-01-17 ENCOUNTER — Telehealth: Payer: Self-pay | Admitting: *Deleted

## 2018-01-17 ENCOUNTER — Ambulatory Visit (HOSPITAL_BASED_OUTPATIENT_CLINIC_OR_DEPARTMENT_OTHER): Payer: BLUE CROSS/BLUE SHIELD

## 2018-01-17 ENCOUNTER — Other Ambulatory Visit: Payer: Self-pay

## 2018-01-17 ENCOUNTER — Ambulatory Visit (HOSPITAL_COMMUNITY): Payer: BLUE CROSS/BLUE SHIELD | Attending: Cardiovascular Disease

## 2018-01-17 DIAGNOSIS — G540 Brachial plexus disorders: Secondary | ICD-10-CM | POA: Insufficient documentation

## 2018-01-17 DIAGNOSIS — R202 Paresthesia of skin: Secondary | ICD-10-CM | POA: Insufficient documentation

## 2018-01-17 DIAGNOSIS — M4802 Spinal stenosis, cervical region: Secondary | ICD-10-CM | POA: Insufficient documentation

## 2018-01-17 DIAGNOSIS — R079 Chest pain, unspecified: Secondary | ICD-10-CM | POA: Diagnosis not present

## 2018-01-17 MED ORDER — TECHNETIUM TC 99M TETROFOSMIN IV KIT
25.2000 | PACK | Freq: Once | INTRAVENOUS | Status: AC | PRN
  Administered 2018-01-17: 25.2 via INTRAVENOUS
  Filled 2018-01-17: qty 26

## 2018-01-17 NOTE — Telephone Encounter (Signed)
Pt called back and haas been notified of echo results by phone with verbal understanding. Pt thanked me for the call. I confirmed Myoview appt 12/20 for the pt.

## 2018-01-17 NOTE — Telephone Encounter (Signed)
-----   Message from Wendall StadePeter C Nishan, MD sent at 01/17/2018  2:09 PM EST ----- EF normal no significant valve disease good

## 2018-01-20 ENCOUNTER — Ambulatory Visit (HOSPITAL_COMMUNITY): Payer: BLUE CROSS/BLUE SHIELD | Attending: Internal Medicine

## 2018-01-20 DIAGNOSIS — G540 Brachial plexus disorders: Secondary | ICD-10-CM | POA: Diagnosis not present

## 2018-01-20 DIAGNOSIS — R079 Chest pain, unspecified: Secondary | ICD-10-CM | POA: Insufficient documentation

## 2018-01-20 DIAGNOSIS — M4802 Spinal stenosis, cervical region: Secondary | ICD-10-CM | POA: Diagnosis not present

## 2018-01-20 DIAGNOSIS — R202 Paresthesia of skin: Secondary | ICD-10-CM | POA: Diagnosis not present

## 2018-01-20 LAB — MYOCARDIAL PERFUSION IMAGING
CHL CUP NUCLEAR SDS: 2
CHL CUP NUCLEAR SSS: 2
LV dias vol: 71 mL (ref 62–150)
LVSYSVOL: 31 mL
Peak HR: 93 {beats}/min
Rest HR: 68 {beats}/min
SRS: 0
TID: 0.89

## 2018-01-20 MED ORDER — REGADENOSON 0.4 MG/5ML IV SOLN
0.4000 mg | Freq: Once | INTRAVENOUS | Status: AC
  Administered 2018-01-20: 0.4 mg via INTRAVENOUS

## 2018-01-20 MED ORDER — TECHNETIUM TC 99M TETROFOSMIN IV KIT
27.1000 | PACK | Freq: Once | INTRAVENOUS | Status: AC | PRN
  Administered 2018-01-20: 27.1 via INTRAVENOUS
  Filled 2018-01-20: qty 28

## 2018-03-27 DIAGNOSIS — R0982 Postnasal drip: Secondary | ICD-10-CM | POA: Diagnosis not present

## 2018-03-27 DIAGNOSIS — J449 Chronic obstructive pulmonary disease, unspecified: Secondary | ICD-10-CM | POA: Diagnosis not present

## 2018-06-09 DIAGNOSIS — E669 Obesity, unspecified: Secondary | ICD-10-CM | POA: Diagnosis not present

## 2018-06-09 DIAGNOSIS — Z125 Encounter for screening for malignant neoplasm of prostate: Secondary | ICD-10-CM | POA: Diagnosis not present

## 2018-06-09 DIAGNOSIS — R7301 Impaired fasting glucose: Secondary | ICD-10-CM | POA: Diagnosis not present

## 2018-06-09 DIAGNOSIS — E785 Hyperlipidemia, unspecified: Secondary | ICD-10-CM | POA: Diagnosis not present

## 2018-06-09 DIAGNOSIS — Z1331 Encounter for screening for depression: Secondary | ICD-10-CM | POA: Diagnosis not present

## 2018-06-09 DIAGNOSIS — F17200 Nicotine dependence, unspecified, uncomplicated: Secondary | ICD-10-CM | POA: Diagnosis not present

## 2018-06-09 DIAGNOSIS — I10 Essential (primary) hypertension: Secondary | ICD-10-CM | POA: Diagnosis not present

## 2018-06-09 DIAGNOSIS — D519 Vitamin B12 deficiency anemia, unspecified: Secondary | ICD-10-CM | POA: Diagnosis not present

## 2018-07-31 ENCOUNTER — Encounter: Payer: Self-pay | Admitting: Nurse Practitioner

## 2018-07-31 ENCOUNTER — Other Ambulatory Visit: Payer: Self-pay

## 2018-07-31 ENCOUNTER — Ambulatory Visit (INDEPENDENT_AMBULATORY_CARE_PROVIDER_SITE_OTHER): Payer: Medicare Other | Admitting: Nurse Practitioner

## 2018-07-31 VITALS — BP 144/80 | HR 66 | Temp 97.7°F | Ht 63.25 in | Wt 191.6 lb

## 2018-07-31 DIAGNOSIS — G4733 Obstructive sleep apnea (adult) (pediatric): Secondary | ICD-10-CM

## 2018-07-31 DIAGNOSIS — J449 Chronic obstructive pulmonary disease, unspecified: Secondary | ICD-10-CM

## 2018-07-31 NOTE — Progress Notes (Signed)
 @Patient  ID: Jared ShyGary R Bryan, male    DOB: [birth year], [age] y.o.   MRN: [id number]  Chief Complaint  Patient presents with  . Follow-up    Yearly - COPD    Referring provider: Paulina FusiSchultz, Douglas E, MD  HPI  63 year old active smoker with COPD and OSA who is followed by Dr. Vassie LollAlva.  Tests:  07/2015 Spirometry -FEV1 of 68% with a ratio of 72 and FVC of 77% small airways were decreased at 45%  PSG 08/2015 AHI 57/h, corrected by BiPAP 12/8   OV 07/31/18 - Follow up   Patient presents today for a follow-up on COPD and OSA.  He was last seen by Dr. Vassie LollAlva on 02/28/2017.  Dates that this is been a stable interval for him.  He states that his PCP has started him on Stiolto and pro-air but he has not used either inhaler.  Unfortunately patient continues to smoke.  He states that he smokes around a pack per day.  He has been referred to lung cancer screening program but states that he refuses to have CT done. Denies f/c/s, n/v/d, hemoptysis, PND, leg swelling.    Patient states that his BiPAP is working well.  No snoring noted.  Patient sleeps around 7 hours per night.  He denies any current issues with mask or pressure settings.  Download in office today shows compliance at 100% with good control noted on bilevel 12/8.  He states that his mask does come off at times during the night and his wife will wake him up to put it back on.   Allergies  Allergen Reactions  . Niacin And Related     Flushing feeling     There is no immunization history on file for this patient.  Past Medical History:  Diagnosis Date  . Anxiety   . Arthritis   . Closed fracture of dorsal (thoracic) vertebra without mention of spinal cord injury   . Colon polyps   . COPD (chronic obstructive pulmonary disease) (HCC)   . Depression   . Dizziness and giddiness   . Emphysema lung (HCC)   . GERD (gastroesophageal reflux disease)   . Hyperlipidemia   . Multiple gastric ulcers   . Other specified disease of hair and  hair follicles   . Sleep apnea   . Thoracic spine fracture (HCC)     Tobacco History: Social History   Tobacco Use  Smoking Status Current Every Day Smoker  . Packs/day: 1.50  . Years: 50.00  . Pack years: 75.00  . Types: Cigarettes  Smokeless Tobacco Former NeurosurgeonUser  . Types: Chew   Ready to quit: No Counseling given: Yes   Outpatient Encounter Medications as of 07/31/2018  Medication Sig  . acetaminophen (TYLENOL) 500 MG tablet Take 500 mg by mouth every 6 (six) hours as needed for mild pain.  [name]. ALPRAZolam (XANAX) 0.25 MG tablet Take 0.25 mg by mouth at bedtime as needed for anxiety.  [name]. amLODipine (NORVASC) 5 MG tablet Take 10 mg by mouth daily.   . cetirizine (ZYRTEC) 10 MG tablet Take 10 mg by mouth daily as needed for allergies.  . nitroGLYCERIN (NITROSTAT) 0.4 MG SL tablet Place 1 tablet (0.4 mg total) under the tongue every 5 (five) minutes as needed for chest pain.  . pantoprazole (PROTONIX) 40 MG tablet Take 40 mg by mouth daily.  [name]. PARoxetine (PAXIL) 20 MG tablet Take 20 mg by mouth daily. *will take 10-20 mg once daily depending on how patient feels*  . [  DISCONTINUED] meloxicam (MOBIC) 7.5 MG tablet Take by mouth.   No facility-administered encounter medications on file as of 07/31/2018.      Review of Systems  Review of Systems  Constitutional: Negative.  Negative for chills and fever.  HENT: Negative.   Respiratory: Negative for cough, shortness of breath and wheezing.   Cardiovascular: Negative.  Negative for chest pain, palpitations and leg swelling.  Gastrointestinal: Negative.   Allergic/Immunologic: Negative.   Neurological: Negative.   Psychiatric/Behavioral: Negative.        Physical Exam  BP (!) 144/80 (BP Location: Left Arm, Patient Position: Sitting, Cuff Size: Normal)   Pulse 66   Temp 97.7 F (36.5 C)   Ht 5' 3.25" (1.607 m)   Wt 191 lb 9.6 oz (86.9 kg)   SpO2 98%   BMI 33.67 kg/m   Wt Readings from Last 5 Encounters:  07/31/18 191 lb  9.6 oz (86.9 kg)  01/17/18 191 lb (86.6 kg)  06/17/17 187 lb (84.8 kg)  02/28/17 190 lb (86.2 kg)  02/26/16 185 lb 9.6 oz (84.2 kg)     Physical Exam Vitals signs and nursing note reviewed.  Constitutional:      General: He is not in acute distress.    Appearance: He is well-developed.  Cardiovascular:     Rate and Rhythm: Normal rate and regular rhythm.  Pulmonary:     Effort: Pulmonary effort is normal. No respiratory distress.     Breath sounds: Normal breath sounds. No wheezing or rhonchi.  Musculoskeletal:        General: No swelling.  Skin:    General: Skin is warm and dry.  Neurological:     Mental Status: He is alert and oriented to person, place, and time.       Assessment & Plan:   COPD (chronic obstructive pulmonary disease) (HCC) Patient presents today for a follow-up on COPD and OSA.  He was last seen by Dr. Vassie LollAlva on 02/28/2017.  Dates that this is been a stable interval for him.  He states that his PCP has started him on Stiolto and pro-air but he has not used either inhaler.  Unfortunately patient continues to smoke.  He states that he smokes around a pack per day.  He has been referred to lung cancer screening program but states that he refuses to have CT done.   Patient Instructions  Patient continues to benefit from BiPAP with good compliance and control documented Will order new supplies and mask of choice Continue BiPAP at current settings Continue current medications Goal of 4 hours or more usage per night Maintain healthy weight Do not drive if drowsy     Follow up with Dr. Vassie LollAlva in 1 year or sooner if needed     OSA (obstructive sleep apnea) Patient states that his BiPAP is working well.  No snoring noted.  Patient sleeps around 7 hours per night.  He denies any current issues with mask or pressure settings.  Download in office today shows compliance at 100% with good control noted on bilevel 12/8.  He states that his mask does come off at times  during the night and his wife will wake him up to put it back on.  Patient Instructions  Patient continues to benefit from BiPAP with good compliance and control documented Will order new supplies and mask of choice Continue BiPAP at current settings Continue current medications Goal of 4 hours or more usage per night Maintain healthy weight Do not drive if  drowsy     Follow up with Dr. Elsworth Soho in 1 year or sooner if needed        Fenton Foy, NP 08/01/2018

## 2018-07-31 NOTE — Patient Instructions (Addendum)
Patient continues to benefit from BiPAP with good compliance and control documented Will order new supplies and mask of choice Continue BiPAP at current settings Continue current medications Goal of 4 hours or more usage per night Maintain healthy weight Do not drive if drowsy     Follow up with Dr. Elsworth Soho in 1 year or sooner if needed

## 2018-08-01 ENCOUNTER — Encounter: Payer: Self-pay | Admitting: Nurse Practitioner

## 2018-08-01 NOTE — Assessment & Plan Note (Signed)
Patient presents today for a follow-up on COPD and OSA.  He was last seen by Dr. Elsworth Soho on 02/28/2017.  Dates that this is been a stable interval for him.  He states that his PCP has started him on Stiolto and pro-air but he has not used either inhaler.  Unfortunately patient continues to smoke.  He states that he smokes around a pack per day.  He has been referred to lung cancer screening program but states that he refuses to have CT done.   Patient Instructions  Patient continues to benefit from BiPAP with good compliance and control documented Will order new supplies and mask of choice Continue BiPAP at current settings Continue current medications Goal of 4 hours or more usage per night Maintain healthy weight Do not drive if drowsy     Follow up with Dr. Elsworth Soho in 1 year or sooner if needed

## 2018-08-01 NOTE — Assessment & Plan Note (Signed)
Patient states that his BiPAP is working well.  No snoring noted.  Patient sleeps around 7 hours per night.  He denies any current issues with mask or pressure settings.  Download in office today shows compliance at 100% with good control noted on bilevel 12/8.  He states that his mask does come off at times during the night and his wife will wake him up to put it back on.  Patient Instructions  Patient continues to benefit from BiPAP with good compliance and control documented Will order new supplies and mask of choice Continue BiPAP at current settings Continue current medications Goal of 4 hours or more usage per night Maintain healthy weight Do not drive if drowsy     Follow up with Dr. Elsworth Soho in 1 year or sooner if needed

## 2018-09-14 DIAGNOSIS — H25813 Combined forms of age-related cataract, bilateral: Secondary | ICD-10-CM | POA: Diagnosis not present

## 2018-09-18 DIAGNOSIS — I10 Essential (primary) hypertension: Secondary | ICD-10-CM | POA: Diagnosis not present

## 2018-09-18 DIAGNOSIS — R600 Localized edema: Secondary | ICD-10-CM | POA: Diagnosis not present

## 2018-09-29 DIAGNOSIS — H25812 Combined forms of age-related cataract, left eye: Secondary | ICD-10-CM | POA: Diagnosis not present

## 2018-09-29 DIAGNOSIS — H25811 Combined forms of age-related cataract, right eye: Secondary | ICD-10-CM | POA: Diagnosis not present

## 2018-09-29 DIAGNOSIS — H2511 Age-related nuclear cataract, right eye: Secondary | ICD-10-CM | POA: Diagnosis not present

## 2018-10-13 DIAGNOSIS — H25812 Combined forms of age-related cataract, left eye: Secondary | ICD-10-CM | POA: Diagnosis not present

## 2018-10-13 DIAGNOSIS — H2512 Age-related nuclear cataract, left eye: Secondary | ICD-10-CM | POA: Diagnosis not present

## 2018-10-18 DIAGNOSIS — Z9181 History of falling: Secondary | ICD-10-CM | POA: Diagnosis not present

## 2018-10-18 DIAGNOSIS — I10 Essential (primary) hypertension: Secondary | ICD-10-CM | POA: Diagnosis not present

## 2018-10-18 DIAGNOSIS — Z6835 Body mass index (BMI) 35.0-35.9, adult: Secondary | ICD-10-CM | POA: Diagnosis not present

## 2018-11-03 DIAGNOSIS — Z961 Presence of intraocular lens: Secondary | ICD-10-CM | POA: Diagnosis not present

## 2018-12-11 DIAGNOSIS — Z0000 Encounter for general adult medical examination without abnormal findings: Secondary | ICD-10-CM | POA: Diagnosis not present

## 2018-12-11 DIAGNOSIS — I10 Essential (primary) hypertension: Secondary | ICD-10-CM | POA: Diagnosis not present

## 2018-12-11 DIAGNOSIS — R7301 Impaired fasting glucose: Secondary | ICD-10-CM | POA: Diagnosis not present

## 2018-12-11 DIAGNOSIS — E785 Hyperlipidemia, unspecified: Secondary | ICD-10-CM | POA: Diagnosis not present

## 2018-12-11 DIAGNOSIS — D519 Vitamin B12 deficiency anemia, unspecified: Secondary | ICD-10-CM | POA: Diagnosis not present

## 2018-12-11 DIAGNOSIS — Z87891 Personal history of nicotine dependence: Secondary | ICD-10-CM | POA: Diagnosis not present

## 2019-02-13 DIAGNOSIS — M7731 Calcaneal spur, right foot: Secondary | ICD-10-CM | POA: Diagnosis not present

## 2019-02-13 DIAGNOSIS — M6701 Short Achilles tendon (acquired), right ankle: Secondary | ICD-10-CM | POA: Diagnosis not present

## 2019-02-13 DIAGNOSIS — M722 Plantar fascial fibromatosis: Secondary | ICD-10-CM | POA: Diagnosis not present

## 2019-05-22 ENCOUNTER — Ambulatory Visit: Payer: BLUE CROSS/BLUE SHIELD | Admitting: Podiatry

## 2019-08-20 DIAGNOSIS — F329 Major depressive disorder, single episode, unspecified: Secondary | ICD-10-CM | POA: Diagnosis not present

## 2019-08-20 DIAGNOSIS — F419 Anxiety disorder, unspecified: Secondary | ICD-10-CM | POA: Diagnosis not present

## 2019-08-20 DIAGNOSIS — Z79899 Other long term (current) drug therapy: Secondary | ICD-10-CM | POA: Diagnosis not present

## 2019-08-20 DIAGNOSIS — R7301 Impaired fasting glucose: Secondary | ICD-10-CM | POA: Diagnosis not present

## 2019-08-20 DIAGNOSIS — Z87891 Personal history of nicotine dependence: Secondary | ICD-10-CM | POA: Diagnosis not present

## 2019-08-20 DIAGNOSIS — Z125 Encounter for screening for malignant neoplasm of prostate: Secondary | ICD-10-CM | POA: Diagnosis not present

## 2019-08-20 DIAGNOSIS — E785 Hyperlipidemia, unspecified: Secondary | ICD-10-CM | POA: Diagnosis not present

## 2019-08-20 DIAGNOSIS — I10 Essential (primary) hypertension: Secondary | ICD-10-CM | POA: Diagnosis not present

## 2019-08-20 DIAGNOSIS — D519 Vitamin B12 deficiency anemia, unspecified: Secondary | ICD-10-CM | POA: Diagnosis not present

## 2019-08-20 DIAGNOSIS — G4733 Obstructive sleep apnea (adult) (pediatric): Secondary | ICD-10-CM | POA: Diagnosis not present

## 2019-11-07 ENCOUNTER — Telehealth: Payer: Self-pay | Admitting: Pulmonary Disease

## 2019-11-07 NOTE — Telephone Encounter (Signed)
Spoke with Larene Beach, wife, listed on [place].  Not sleeping well at night, sleepy during the day.  Taking Xanax as needed for shaking.  Continuing to smoke a pack a day.  Has not been seen since 07/2018.  Requesting an appointment.  Wife to call patient and call me back regarding scheduling an appointment.  No openings for Dr. Vassie Loll until the first of November, scheduled to see Tammy Parrett NP 11/09/19 at 2 pm.  Nothing further needed.

## 2019-11-09 ENCOUNTER — Ambulatory Visit: Payer: No Typology Code available for payment source | Admitting: Adult Health

## 2019-11-12 ENCOUNTER — Ambulatory Visit (INDEPENDENT_AMBULATORY_CARE_PROVIDER_SITE_OTHER): Payer: Medicare Other | Admitting: Primary Care

## 2019-11-12 ENCOUNTER — Encounter: Payer: Self-pay | Admitting: Primary Care

## 2019-11-12 ENCOUNTER — Other Ambulatory Visit: Payer: Self-pay

## 2019-11-12 VITALS — BP 134/84 | HR 82 | Ht 63.25 in | Wt 189.0 lb

## 2019-11-12 DIAGNOSIS — G4733 Obstructive sleep apnea (adult) (pediatric): Secondary | ICD-10-CM | POA: Diagnosis not present

## 2019-11-12 DIAGNOSIS — R0602 Shortness of breath: Secondary | ICD-10-CM

## 2019-11-12 DIAGNOSIS — R600 Localized edema: Secondary | ICD-10-CM | POA: Diagnosis not present

## 2019-11-12 DIAGNOSIS — F17200 Nicotine dependence, unspecified, uncomplicated: Secondary | ICD-10-CM | POA: Diagnosis not present

## 2019-11-12 NOTE — Assessment & Plan Note (Signed)
-   PSG 2017 showed severe OSA, AHI 57/hr. Patient is 100% compliant with BIPAP. He has no trouble falling asleep. Frequent nocturnal awakenings with moderate daytime fatigue. BIPAP 12/8 with no residual AHI. Epworth score today 13.  - Recommend patient limit fluids in the evening, push back bedtime from 8:30 to 9:30pm and follow-up with PCP regarding medication adjustment for depression. If no improvement in sleep/daytime fatigue may consider wakefulness promoting agent  - Checking BMET today

## 2019-11-12 NOTE — Assessment & Plan Note (Addendum)
-   Patient continues to smoker 1ppd, he is not interested in quitting at this time. He is aware that this puts him at increased risk for cardiovascular disease, stroke, diabetes, COPD, lung disease and cancer

## 2019-11-12 NOTE — Patient Instructions (Addendum)
Recommendations: - Try and push back your bedtime to 9-9:30pm  - Limit amount of fluid you are drinking especially after 6pm - Do not drink caffeinated beverages after 4pm - Advised you speak with Dr. Tomasa Blase about medication management for depression - If above does not help with sleeping/daytime fatigue discuss with Dr. Vassie Loll about potential for other medications  Follow-up:  - 6-8 weeks with Dr. Vassie Loll   CPAP and BPAP Information CPAP and BPAP are methods of helping a person breathe with the use of air pressure. CPAP stands for "continuous positive airway pressure." BPAP stands for "bi-level positive airway pressure." In both methods, air is blown through your nose or mouth and into your air passages to help you breathe well. CPAP and BPAP use different amounts of pressure to blow air. With CPAP, the amount of pressure stays the same while you breathe in and out. With BPAP, the amount of pressure is increased when you breathe in (inhale) so that you can take larger breaths. Your health care provider will recommend whether CPAP or BPAP would be more helpful for you. Why are CPAP and BPAP treatments used? CPAP or BPAP can be helpful if you have:  Sleep apnea.  Chronic obstructive pulmonary disease (COPD).  Heart failure.  Medical conditions that weaken the muscles of the chest including muscular dystrophy, or neurological diseases such as amyotrophic lateral sclerosis (ALS).  Other problems that cause breathing to be weak, abnormal, or difficult. CPAP is most commonly used for obstructive sleep apnea (OSA) to keep the airways from collapsing when the muscles relax during sleep. How is CPAP or BPAP administered? Both CPAP and BPAP are provided by a small machine with a flexible plastic tube that attaches to a plastic mask. You wear the mask. Air is blown through the mask into your nose or mouth. The amount of pressure that is used to blow the air can be adjusted on the machine. Your health  care provider will determine the pressure setting that should be used based on your individual needs. When should CPAP or BPAP be used? In most cases, the mask only needs to be worn during sleep. Generally, the mask needs to be worn throughout the night and during any daytime naps. People with certain medical conditions may also need to wear the mask at other times when they are awake. Follow instructions from your health care provider about when to use the machine. What are some tips for using the mask?   Because the mask needs to be snug, some people feel trapped or closed-in (claustrophobic) when first using the mask. If you feel this way, you may need to get used to the mask. One way to do this is by holding the mask loosely over your nose or mouth and then gradually applying the mask more snugly. You can also gradually increase the amount of time that you use the mask.  Masks are available in various types and sizes. Some fit over your mouth and nose while others fit over just your nose. If your mask does not fit well, talk with your health care provider about getting a different one.  If you are using a mask that fits over your nose and you tend to breathe through your mouth, a chin strap may be applied to help keep your mouth closed.  The CPAP and BPAP machines have alarms that may sound if the mask comes off or develops a leak.  If you have trouble with the mask, it is  very important that you talk with your health care provider about finding a way to make the mask easier to tolerate. Do not stop using the mask. Stopping the use of the mask could have a negative impact on your health. What are some tips for using the machine?  Place your CPAP or BPAP machine on a secure table or stand near an electrical outlet.  Know where the on/off switch is located on the machine.  Follow instructions from your health care provider about how to set the pressure on your machine and when you should use  it.  Do not eat or drink while the CPAP or BPAP machine is on. Food or fluids could get pushed into your lungs by the pressure of the CPAP or BPAP.  Do not smoke. Tobacco smoke residue can damage the machine.  For home use, CPAP and BPAP machines can be rented or purchased through home health care companies. Many different brands of machines are available. Renting a machine before purchasing may help you find out which particular machine works well for you.  Keep the CPAP or BPAP machine and attachments clean. Ask your health care provider for specific instructions. Get help right away if:  You have redness or open areas around your nose or mouth where the mask fits.  You have trouble using the CPAP or BPAP machine.  You cannot tolerate wearing the CPAP or BPAP mask.  You have pain, discomfort, and bloating in your abdomen. Summary  CPAP and BPAP are methods of helping a person breathe with the use of air pressure.  Both CPAP and BPAP are provided by a small machine with a flexible plastic tube that attaches to a plastic mask.  If you have trouble with the mask, it is very important that you talk with your health care provider about finding a way to make the mask easier to tolerate. This information is not intended to replace advice given to you by your health care provider. Make sure you discuss any questions you have with your health care provider. Document Revised: 05/10/2018 Document Reviewed: 12/08/2015 Elsevier Patient Education  2020 [employer].

## 2019-11-12 NOTE — Progress Notes (Signed)
@Patient  ID: , male    DOB: [birth year], 64 y.o.   MRN: [id number]  Chief Complaint  Patient presents with  . Follow-up    OSA    Referring provider: [id number], MD  HPI: 64 year old male, current every day smoker. PMH significant for COPD, OSA, GERD. Patient of Dr. 71, last seen by pulmonary NP on 07/31/18. PSG 2017 showed severe obstructive sleep apnea, AHI 57/hr. Corrected with BIPAP 12/8. Previously non-compliant with inhaler regimen and declined LDCT.   11/12/2019 Patient over due for follow-up. Patient's wife called our office on 11/07/19 with reports of patient not sleeping well at night with increased drowsiness during the day. This has been worse over the last 4 months. Wife states he has been having to take xanax more. He reports increase in stress/anxiety d.t work. He is compliant with paxil 20mg  but does not think it is working anymore. Things that used to not bother him at work are now getting to him. He is 100% compliant with BIPAP use. He has no trouble fallings asleep at night. He gets about 1-2 hours increments of sleep at a time before waking up at night, often due to dry mouth or having to use restroom. He drinks 4 cups of coffee in the morning, 4-10 small mountain dews during the day and two 16oz de-caffeinated sweet tea with dinner and another after dinner. Experiencing some airleaks, typically happens when mask slides to the side of his face and resolved when he re--adjusts his mask. This does not appear to bother him or wake him up.  Weight has remained the same, he has not lost or gain a significant amount that would be effecting his sleep. He has no significant shortness of breath, reports some dyspnea with moderate exertion. He continues to smoker a pack a day. He is not interested in quitting.   Airview download 10/13/19-11/11/19 30/30 days used (100%); 29 days (97%) > 4 hours Average usage 6 hours 23 mins Pressure IPAP 12; EPAP 8 cm h20 Airleaks  30l/min AHI 0.7  Pulmonary testing: Spirometry 07/2015 - FEV1 68% with ratio 72 and FVC 77% small airways were decreased at 45%  Sleep testing: PSG 08/2015 AHI 57/h, corrected by BiPAP 12/8  Allergies  Allergen Reactions  . Niacin And Related     Flushing feeling     There is no immunization history on file for this patient.  Past Medical History:  Diagnosis Date  . Anxiety   . Arthritis   . Closed fracture of dorsal (thoracic) vertebra without mention of spinal cord injury   . Colon polyps   . COPD (chronic obstructive pulmonary disease) (HCC)   . Depression   . Dizziness and giddiness   . Emphysema lung (HCC)   . GERD (gastroesophageal reflux disease)   . Hyperlipidemia   . Multiple gastric ulcers   . Other specified disease of hair and hair follicles   . Sleep apnea   . Thoracic spine fracture (HCC)     Tobacco History: Social History   Tobacco Use  Smoking Status Current Every Day Smoker  . Packs/day: 1.50  . Years: 50.00  . Pack years: 75.00  . Types: Cigarettes  Smokeless Tobacco Former 09/2015  . Types: Chew   Ready to quit: Not Answered Counseling given: Not Answered   Outpatient Medications Prior to Visit  Medication Sig Dispense Refill  . acetaminophen (TYLENOL) 500 MG tablet Take 500 mg by mouth every 6 (six) hours as  needed for mild pain.    [name] ALPRAZolam (XANAX) 0.25 MG tablet Take 0.25 mg by mouth at bedtime as needed for anxiety.    . cetirizine (ZYRTEC) 10 MG tablet Take 10 mg by mouth daily as needed for allergies.    . nitroGLYCERIN (NITROSTAT) 0.4 MG SL tablet Place 1 tablet (0.4 mg total) under the tongue every 5 (five) minutes as needed for chest pain. 25 tablet 1  . pantoprazole (PROTONIX) 40 MG tablet Take 40 mg by mouth daily.    [name] PARoxetine (PAXIL) 20 MG tablet Take 20 mg by mouth daily. *will take 10-20 mg once daily depending on how patient feels*    . amLODipine (NORVASC) 5 MG tablet Take 10 mg by mouth daily.      No  facility-administered medications prior to visit.   Review of Systems  Review of Systems  Constitutional: Positive for fatigue.  Respiratory: Negative for cough, chest tightness, shortness of breath and wheezing.   Cardiovascular: Positive for leg swelling.  Psychiatric/Behavioral: Positive for sleep disturbance.    Physical Exam  BP 134/84 (BP Location: Right Arm, Cuff Size: Normal)   Pulse 82   Ht 5' 3.25" (1.607 m)   Wt 189 lb (85.7 kg)   SpO2 96%   BMI 33.22 kg/m  Physical Exam Constitutional:      Appearance: Normal appearance.  HENT:     Head: Normocephalic and atraumatic.     Mouth/Throat:     Comments: Deferred d/t masking Cardiovascular:     Rate and Rhythm: Normal rate and regular rhythm.     Comments: Trace pedal edema Pulmonary:     Effort: Pulmonary effort is normal.     Breath sounds: No wheezing or rhonchi.  Abdominal:     Palpations: There is no mass.     Tenderness: There is no abdominal tenderness. There is no guarding.     Comments: Large, firm  Skin:    General: Skin is warm and dry.  Neurological:     General: No focal deficit present.     Mental Status: He is alert and oriented to person, place, and time. Mental status is at baseline.  Psychiatric:        Mood and Affect: Mood normal.        Behavior: Behavior normal.        Thought Content: Thought content normal.        Judgment: Judgment normal.      Lab Results:  CBC    Component Value Date/Time   WBC 7.3 10/04/2013 0945   RBC 4.36 10/04/2013 0945   HGB 13.6 10/04/2013 0945   HCT 40.0 10/04/2013 0945   PLT 267.0 10/04/2013 0945   MCV 91.8 10/04/2013 0945   MCHC 34.0 10/04/2013 0945   RDW 14.5 10/04/2013 0945   LYMPHSABS 2.0 10/04/2013 0945   MONOABS 0.5 10/04/2013 0945   EOSABS 0.1 10/04/2013 0945   BASOSABS 0.0 10/04/2013 0945    BMET    Component Value Date/Time   NA 138 10/04/2013 0945   K 3.8 10/04/2013 0945   CL 105 10/04/2013 0945   CO2 27 10/04/2013 0945    GLUCOSE 91 10/04/2013 0945   BUN 17 10/04/2013 0945   CREATININE 0.9 10/04/2013 0945   CALCIUM 9.3 10/04/2013 0945    BNP No results found for: BNP  ProBNP No results found for: PROBNP  Imaging: No results found.   Assessment & Plan:   OSA (obstructive sleep apnea) - PSG 2017 showed  severe OSA, AHI 57/hr. Patient is 100% compliant with BIPAP. He has no trouble falling asleep. Frequent nocturnal awakenings with moderate daytime fatigue. BIPAP 12/8 with no residual AHI. Epworth score today 13.  - Recommend patient limit fluids in the evening, push back bedtime from 8:30 to 9:30pm and follow-up with PCP regarding medication adjustment for depression. If no improvement in sleep/daytime fatigue may consider wakefulness promoting agent  - Checking BMET today   Smoking - Patient continues to smoker 1ppd, he is not interested in quitting at this time. He is aware that this puts him at increased risk for cardiovascular disease, stroke, diabetes, COPD, lung disease and cancer   Pedal edema - Mild on exam, DOE with exertion - Checking BNP   Glenford Bayley, NP 11/12/2019

## 2019-11-12 NOTE — Assessment & Plan Note (Signed)
-   Mild on exam, DOE with exertion - Checking BNP

## 2019-11-13 LAB — BRAIN NATRIURETIC PEPTIDE: Pro B Natriuretic peptide (BNP): 32 pg/mL (ref 0.0–100.0)

## 2019-11-13 LAB — BASIC METABOLIC PANEL
BUN: 11 mg/dL (ref 6–23)
CO2: 28 mEq/L (ref 19–32)
Calcium: 9.2 mg/dL (ref 8.4–10.5)
Chloride: 104 mEq/L (ref 96–112)
Creatinine, Ser: 1.01 mg/dL (ref 0.40–1.50)
GFR: 76.88 mL/min (ref >60.00)
Glucose, Bld: 95 mg/dL (ref 70–99)
Potassium: 3.9 mEq/L (ref 3.5–5.1)
Sodium: 138 mEq/L (ref 135–145)

## 2019-11-13 NOTE — Progress Notes (Signed)
BNP and BMET were normal. No changes to current plan.

## 2019-11-15 DIAGNOSIS — F419 Anxiety disorder, unspecified: Secondary | ICD-10-CM | POA: Diagnosis not present

## 2019-11-15 DIAGNOSIS — D519 Vitamin B12 deficiency anemia, unspecified: Secondary | ICD-10-CM | POA: Diagnosis not present

## 2019-11-15 DIAGNOSIS — F32A Depression, unspecified: Secondary | ICD-10-CM | POA: Diagnosis not present

## 2019-11-15 DIAGNOSIS — I10 Essential (primary) hypertension: Secondary | ICD-10-CM | POA: Diagnosis not present

## 2019-11-15 DIAGNOSIS — Z6831 Body mass index (BMI) 31.0-31.9, adult: Secondary | ICD-10-CM | POA: Diagnosis not present

## 2019-11-15 DIAGNOSIS — G4733 Obstructive sleep apnea (adult) (pediatric): Secondary | ICD-10-CM | POA: Diagnosis not present

## 2019-11-15 DIAGNOSIS — E785 Hyperlipidemia, unspecified: Secondary | ICD-10-CM | POA: Diagnosis not present

## 2019-11-15 DIAGNOSIS — R7301 Impaired fasting glucose: Secondary | ICD-10-CM | POA: Diagnosis not present

## 2019-12-31 ENCOUNTER — Ambulatory Visit (INDEPENDENT_AMBULATORY_CARE_PROVIDER_SITE_OTHER): Payer: Medicare Other

## 2019-12-31 ENCOUNTER — Ambulatory Visit (INDEPENDENT_AMBULATORY_CARE_PROVIDER_SITE_OTHER): Payer: Medicare Other | Admitting: Pulmonary Disease

## 2019-12-31 ENCOUNTER — Encounter: Payer: Self-pay | Admitting: Pulmonary Disease

## 2019-12-31 ENCOUNTER — Other Ambulatory Visit: Payer: Self-pay

## 2019-12-31 DIAGNOSIS — J432 Centrilobular emphysema: Secondary | ICD-10-CM

## 2019-12-31 DIAGNOSIS — G4733 Obstructive sleep apnea (adult) (pediatric): Secondary | ICD-10-CM | POA: Diagnosis not present

## 2019-12-31 DIAGNOSIS — J811 Chronic pulmonary edema: Secondary | ICD-10-CM | POA: Diagnosis not present

## 2019-12-31 DIAGNOSIS — R0600 Dyspnea, unspecified: Secondary | ICD-10-CM | POA: Diagnosis not present

## 2019-12-31 MED ORDER — ALBUTEROL SULFATE HFA 108 (90 BASE) MCG/ACT IN AERS: 2.0000 | INHALATION_SPRAY | Freq: Four times a day (QID) | RESPIRATORY_TRACT | 6 refills | Status: DC | PRN

## 2019-12-31 NOTE — Assessment & Plan Note (Signed)
Chest x-ray today. Trial of albuterol MDI 2 puffs every 6 hours as needed only for shortness of breath or wheezing Lung function was surprisingly preserved in 2019 He has a nihilistic attitude "what good it will do for me to quit now" -we discussed benefits of smoking cessation again Still does not want screening CT scan "I do not want to know"

## 2019-12-31 NOTE — Patient Instructions (Signed)
Chest x-ray today. Trial of albuterol MDI 2 puffs every 6 hours as needed only for shortness of breath or wheezing   Try to cut down on your cigarettes. Try to decrease caffeine intake -dropped to 2 cups of coffee in the morning and decrease [facility]. Covid vaccine recommended

## 2019-12-31 NOTE — Addendum Note (Signed)
Addended by: Delrae Rend on: 12/31/2019 10:01 AM   Modules accepted: Orders

## 2019-12-31 NOTE — Assessment & Plan Note (Signed)
He reports insomnia sleep maintenance. I asked him to cut down on his caffeine intake Try to cut down on your cigarettes. Try to decrease caffeine intake -dropped to 2 cups of coffee in the morning and decrease [facility]. BiPAP download was reviewed which shows excellent control of events on BiPAP 12/8 cm, mild leak, good compliance Weight loss also encouraged

## 2019-12-31 NOTE — Progress Notes (Signed)
   Subjective:    Patient ID: Jared Bryan, male    DOB: 02/16/1955, 64 y.o.   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  Chief Complaint  Patient presents with  . Follow-up    feels he needs more pressure for CPAP, not sleeping well.   Reports increased shortness of breath.  "I am too fat" Still working in the roofing business. Reports difficulty maintaining sleep and wakes up several times during the night at the same time he is also falling asleep whenever he sits down. Continues to smoke 1 to 1.5 packs/day. Drinks 4 -6 cups of coffee in the morning, 5-6 small cans of Mountain Dew in 1-2 regular cans also decaffeinated sweet tea  Has not taken Covid vaccine yet, does not want flu shot  Significant tests/ events reviewed  Spirometry 02/2017 ratio of 81 and FEV1 of 77% >> mild restriction  07/2015 Spirometry -FEV1 of 68% with a ratio of 72 and FVC of 77% small airways were decreased at 45%  PSG 08/2015 AHI 57/h, corrected by BiPAP 12/8  Review of Systems neg for any significant sore throat, dysphagia, itching, sneezing, nasal congestion or excess/ purulent secretions, fever, chills, sweats, unintended wt loss, pleuritic or exertional cp, hempoptysis, orthopnea pnd or change in chronic leg swelling. Also denies presyncope, palpitations, heartburn, abdominal pain, nausea, vomiting, diarrhea or change in bowel or urinary habits, dysuria,hematuria, rash, arthralgias, visual complaints, headache, numbness weakness or ataxia.     Objective:   Physical Exam  Gen. Pleasant, obese, in no distress ENT - no lesions, no post nasal drip Neck: No JVD, no thyromegaly, no carotid bruits Lungs: no use of accessory muscles, no dullness to percussion, decreased without rales or rhonchi  Cardiovascular: Rhythm regular, heart sounds  normal, no murmurs or  gallops, no peripheral edema Musculoskeletal: No deformities, no cyanosis or clubbing , no tremors       Assessment & Plan:

## 2020-01-02 NOTE — Progress Notes (Signed)
Called and went over xray results per Dr Vassie Loll. All questions answered and patient expressed full understanding. Nothing further needed at this time.

## 2020-01-04 DIAGNOSIS — Z961 Presence of intraocular lens: Secondary | ICD-10-CM | POA: Diagnosis not present

## 2020-02-20 DIAGNOSIS — F32A Depression, unspecified: Secondary | ICD-10-CM | POA: Diagnosis not present

## 2020-02-20 DIAGNOSIS — R7301 Impaired fasting glucose: Secondary | ICD-10-CM | POA: Diagnosis not present

## 2020-02-20 DIAGNOSIS — F419 Anxiety disorder, unspecified: Secondary | ICD-10-CM | POA: Diagnosis not present

## 2020-02-20 DIAGNOSIS — E785 Hyperlipidemia, unspecified: Secondary | ICD-10-CM | POA: Diagnosis not present

## 2020-02-20 DIAGNOSIS — G4733 Obstructive sleep apnea (adult) (pediatric): Secondary | ICD-10-CM | POA: Diagnosis not present

## 2020-02-20 DIAGNOSIS — I10 Essential (primary) hypertension: Secondary | ICD-10-CM | POA: Diagnosis not present

## 2020-02-20 DIAGNOSIS — D519 Vitamin B12 deficiency anemia, unspecified: Secondary | ICD-10-CM | POA: Diagnosis not present

## 2020-02-20 DIAGNOSIS — Z6831 Body mass index (BMI) 31.0-31.9, adult: Secondary | ICD-10-CM | POA: Diagnosis not present

## 2020-03-18 DIAGNOSIS — Z1331 Encounter for screening for depression: Secondary | ICD-10-CM | POA: Diagnosis not present

## 2020-03-18 DIAGNOSIS — E669 Obesity, unspecified: Secondary | ICD-10-CM | POA: Diagnosis not present

## 2020-03-18 DIAGNOSIS — Z0000 Encounter for general adult medical examination without abnormal findings: Secondary | ICD-10-CM | POA: Diagnosis not present

## 2020-03-18 DIAGNOSIS — Z9181 History of falling: Secondary | ICD-10-CM | POA: Diagnosis not present

## 2020-05-15 DIAGNOSIS — F419 Anxiety disorder, unspecified: Secondary | ICD-10-CM | POA: Diagnosis not present

## 2020-05-15 DIAGNOSIS — E785 Hyperlipidemia, unspecified: Secondary | ICD-10-CM | POA: Diagnosis not present

## 2020-05-15 DIAGNOSIS — E1169 Type 2 diabetes mellitus with other specified complication: Secondary | ICD-10-CM | POA: Diagnosis not present

## 2020-05-15 DIAGNOSIS — D519 Vitamin B12 deficiency anemia, unspecified: Secondary | ICD-10-CM | POA: Diagnosis not present

## 2020-05-15 DIAGNOSIS — F32A Depression, unspecified: Secondary | ICD-10-CM | POA: Diagnosis not present

## 2020-05-15 DIAGNOSIS — I10 Essential (primary) hypertension: Secondary | ICD-10-CM | POA: Diagnosis not present

## 2020-05-15 DIAGNOSIS — G4733 Obstructive sleep apnea (adult) (pediatric): Secondary | ICD-10-CM | POA: Diagnosis not present

## 2020-05-15 DIAGNOSIS — Z6831 Body mass index (BMI) 31.0-31.9, adult: Secondary | ICD-10-CM | POA: Diagnosis not present

## 2020-06-23 DIAGNOSIS — H353131 Nonexudative age-related macular degeneration, bilateral, early dry stage: Secondary | ICD-10-CM | POA: Diagnosis not present

## 2020-07-24 DIAGNOSIS — G4733 Obstructive sleep apnea (adult) (pediatric): Secondary | ICD-10-CM | POA: Diagnosis not present

## 2020-10-20 ENCOUNTER — Telehealth: Payer: Self-pay | Admitting: Pulmonary Disease

## 2020-10-20 NOTE — Telephone Encounter (Signed)
Last seen 12/2019, so he is due for an office visit.  Reviewed BiPAP download 12/8 shows good control of events and excellent compliance up to 7 hours per night, no missed nights, mild leak.  No reason for hypersomnolence identified on this download.  Pressure seems to be adequate and no changes necessary at this time   I have left a message on VM for the pt/his wife to call the office back.

## 2020-10-20 NOTE — Telephone Encounter (Signed)
Called and spoke with pt's spouse Vickie who states that pt is still having issues for at least 6 months now stating that pt feels like he is not getting enough pressure/air from the bipap machine.  Vickie said that pt is back to where he was prior to beginning therapy taking naps throughout the day. States after eating, pt is asleep after 10-15 minutes. Vickie said that pt is also up every 2-3 hours and also states that pt is not getting much sleep.  Also states that pt does have a headache almost every day due to what has been happening. Pt is also falling asleep while driving.  Vickie wants to know what might be recommended to help with this. Download has been printed for Dr. Vassie Loll to review.  Routing encounter to Dr. Vassie Loll, please advise.

## 2020-10-21 NOTE — Telephone Encounter (Signed)
Spoke with pt and reviewed Dr. Reginia Naas recommendations along with recommendation to decrease/stop smoking. Scheduled pt for 12 f/u OV in November. Pt stated understanding, nothing further needed at this time.

## 2020-11-18 DIAGNOSIS — I10 Essential (primary) hypertension: Secondary | ICD-10-CM | POA: Diagnosis not present

## 2020-11-18 DIAGNOSIS — D519 Vitamin B12 deficiency anemia, unspecified: Secondary | ICD-10-CM | POA: Diagnosis not present

## 2020-11-18 DIAGNOSIS — G4733 Obstructive sleep apnea (adult) (pediatric): Secondary | ICD-10-CM | POA: Diagnosis not present

## 2020-11-18 DIAGNOSIS — Z87891 Personal history of nicotine dependence: Secondary | ICD-10-CM | POA: Diagnosis not present

## 2020-11-18 DIAGNOSIS — E785 Hyperlipidemia, unspecified: Secondary | ICD-10-CM | POA: Diagnosis not present

## 2020-11-18 DIAGNOSIS — F32A Depression, unspecified: Secondary | ICD-10-CM | POA: Diagnosis not present

## 2020-11-18 DIAGNOSIS — E1169 Type 2 diabetes mellitus with other specified complication: Secondary | ICD-10-CM | POA: Diagnosis not present

## 2020-12-18 DIAGNOSIS — G4733 Obstructive sleep apnea (adult) (pediatric): Secondary | ICD-10-CM | POA: Diagnosis not present

## 2020-12-22 ENCOUNTER — Ambulatory Visit (INDEPENDENT_AMBULATORY_CARE_PROVIDER_SITE_OTHER): Payer: Medicare Other | Admitting: Pulmonary Disease

## 2020-12-22 ENCOUNTER — Other Ambulatory Visit: Payer: Self-pay

## 2020-12-22 ENCOUNTER — Encounter: Payer: Self-pay | Admitting: Pulmonary Disease

## 2020-12-22 DIAGNOSIS — G4733 Obstructive sleep apnea (adult) (pediatric): Secondary | ICD-10-CM

## 2020-12-22 DIAGNOSIS — J432 Centrilobular emphysema: Secondary | ICD-10-CM

## 2020-12-22 NOTE — Assessment & Plan Note (Signed)
BiPAP download was reviewed which again shows good control of events on 12/8 cm but mild leak, excellent compliance more than 7 hours every night. Is very compliant and BiPAP has certainly helped improve daytime somnolence and fatigue  Weight loss encouraged, compliance with goal of at least 4-6 hrs every night is the expectation. Advised against medications with sedative side effects Cautioned against driving when sleepy - understanding that sleepiness will vary on a day to day basis

## 2020-12-22 NOTE — Patient Instructions (Addendum)
BiPAP is working well  Trial of melatonin OTC 5 mg  1h before bedtime

## 2020-12-22 NOTE — Assessment & Plan Note (Signed)
We does not want medications/bronchodilators. Smoking cessation again discussed as the most important intervention here. I discussed low-dose CT screening but he is worried that this will cause more anxiety and does not want to pursue

## 2020-12-22 NOTE — Progress Notes (Signed)
   Subjective:    Patient ID: Jared Bryan, male    DOB: 07-Jul-1955, 65 y.o.   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 He is planning retirement in January, he is building a cabin in [place]. Breathing is stable, denies cough or wheezing Blood pressure is high today but he had to rush to our office, but by mistake to a [place]  office and then was directed to [facility] before coming here  He states that is not as rested as usual with the BiPAP Significant tests/ events reviewed   Spirometry 02/2017 ratio of 81 and FEV1 of 77% >> mild restriction   07/2015 Spirometry - FEV1 of 68% with a ratio of 72 and FVC of 77% small airways were decreased at 45%   PSG 08/2015 AHI 57/h, corrected by BiPAP 12/8  Review of Systems neg for any significant sore throat, dysphagia, itching, sneezing, nasal congestion or excess/ purulent secretions, fever, chills, sweats, unintended wt loss, pleuritic or exertional cp, hempoptysis, orthopnea pnd or change in chronic leg swelling. Also denies presyncope, palpitations, heartburn, abdominal pain, nausea, vomiting, diarrhea or change in bowel or urinary habits, dysuria,hematuria, rash, arthralgias, visual complaints, headache, numbness weakness or ataxia.     Objective:   Physical Exam  Gen. Pleasant, elderly, obese, in no distress ENT - no lesions, no post nasal drip Neck: No JVD, no thyromegaly, no carotid bruits Lungs: no use of accessory muscles, no dullness to percussion, decreased without rales or rhonchi  Cardiovascular: Rhythm regular, heart sounds  normal, no murmurs or gallops, no peripheral edema Musculoskeletal: No deformities, no cyanosis or clubbing , no tremors       Assessment & Plan:

## 2021-02-10 DIAGNOSIS — R030 Elevated blood-pressure reading, without diagnosis of hypertension: Secondary | ICD-10-CM | POA: Diagnosis not present

## 2021-02-10 DIAGNOSIS — M4722 Other spondylosis with radiculopathy, cervical region: Secondary | ICD-10-CM | POA: Diagnosis not present

## 2021-02-11 DIAGNOSIS — M4802 Spinal stenosis, cervical region: Secondary | ICD-10-CM | POA: Diagnosis not present

## 2021-02-11 DIAGNOSIS — M542 Cervicalgia: Secondary | ICD-10-CM | POA: Diagnosis not present

## 2021-02-11 DIAGNOSIS — M2578 Osteophyte, vertebrae: Secondary | ICD-10-CM | POA: Diagnosis not present

## 2021-02-11 DIAGNOSIS — M4722 Other spondylosis with radiculopathy, cervical region: Secondary | ICD-10-CM | POA: Diagnosis not present

## 2021-02-17 DIAGNOSIS — M4722 Other spondylosis with radiculopathy, cervical region: Secondary | ICD-10-CM | POA: Diagnosis not present

## 2021-02-17 DIAGNOSIS — I10 Essential (primary) hypertension: Secondary | ICD-10-CM | POA: Diagnosis not present

## 2021-02-26 DIAGNOSIS — M4722 Other spondylosis with radiculopathy, cervical region: Secondary | ICD-10-CM | POA: Diagnosis not present

## 2021-02-26 DIAGNOSIS — Z472 Encounter for removal of internal fixation device: Secondary | ICD-10-CM | POA: Diagnosis not present

## 2021-02-26 DIAGNOSIS — M4802 Spinal stenosis, cervical region: Secondary | ICD-10-CM | POA: Diagnosis not present

## 2021-02-26 DIAGNOSIS — M2578 Osteophyte, vertebrae: Secondary | ICD-10-CM | POA: Diagnosis not present

## 2021-03-04 DIAGNOSIS — M542 Cervicalgia: Secondary | ICD-10-CM | POA: Diagnosis not present

## 2021-03-04 DIAGNOSIS — Z9889 Other specified postprocedural states: Secondary | ICD-10-CM | POA: Diagnosis not present

## 2021-03-19 DIAGNOSIS — M5412 Radiculopathy, cervical region: Secondary | ICD-10-CM | POA: Diagnosis not present

## 2021-04-03 DIAGNOSIS — Z0000 Encounter for general adult medical examination without abnormal findings: Secondary | ICD-10-CM | POA: Diagnosis not present

## 2021-04-03 DIAGNOSIS — Z139 Encounter for screening, unspecified: Secondary | ICD-10-CM | POA: Diagnosis not present

## 2021-04-03 DIAGNOSIS — E785 Hyperlipidemia, unspecified: Secondary | ICD-10-CM | POA: Diagnosis not present

## 2021-04-03 DIAGNOSIS — Z9181 History of falling: Secondary | ICD-10-CM | POA: Diagnosis not present

## 2021-05-19 DIAGNOSIS — G4733 Obstructive sleep apnea (adult) (pediatric): Secondary | ICD-10-CM | POA: Diagnosis not present

## 2021-05-19 DIAGNOSIS — D519 Vitamin B12 deficiency anemia, unspecified: Secondary | ICD-10-CM | POA: Diagnosis not present

## 2021-05-19 DIAGNOSIS — E785 Hyperlipidemia, unspecified: Secondary | ICD-10-CM | POA: Diagnosis not present

## 2021-05-19 DIAGNOSIS — Z87891 Personal history of nicotine dependence: Secondary | ICD-10-CM | POA: Diagnosis not present

## 2021-05-19 DIAGNOSIS — I10 Essential (primary) hypertension: Secondary | ICD-10-CM | POA: Diagnosis not present

## 2021-05-19 DIAGNOSIS — E1169 Type 2 diabetes mellitus with other specified complication: Secondary | ICD-10-CM | POA: Diagnosis not present

## 2021-08-27 DIAGNOSIS — I10 Essential (primary) hypertension: Secondary | ICD-10-CM | POA: Diagnosis not present

## 2021-08-27 DIAGNOSIS — G4733 Obstructive sleep apnea (adult) (pediatric): Secondary | ICD-10-CM | POA: Diagnosis not present

## 2021-08-27 DIAGNOSIS — D519 Vitamin B12 deficiency anemia, unspecified: Secondary | ICD-10-CM | POA: Diagnosis not present

## 2021-08-27 DIAGNOSIS — E785 Hyperlipidemia, unspecified: Secondary | ICD-10-CM | POA: Diagnosis not present

## 2021-08-27 DIAGNOSIS — E1169 Type 2 diabetes mellitus with other specified complication: Secondary | ICD-10-CM | POA: Diagnosis not present

## 2021-09-07 DIAGNOSIS — G4733 Obstructive sleep apnea (adult) (pediatric): Secondary | ICD-10-CM | POA: Diagnosis not present

## 2021-09-10 ENCOUNTER — Ambulatory Visit: Payer: Medicare Other | Admitting: Adult Health

## 2021-09-14 DIAGNOSIS — G4733 Obstructive sleep apnea (adult) (pediatric): Secondary | ICD-10-CM | POA: Diagnosis not present

## 2021-09-14 DIAGNOSIS — J301 Allergic rhinitis due to pollen: Secondary | ICD-10-CM | POA: Diagnosis not present

## 2021-09-14 DIAGNOSIS — R400 Somnolence: Secondary | ICD-10-CM | POA: Diagnosis not present

## 2021-09-14 DIAGNOSIS — F17210 Nicotine dependence, cigarettes, uncomplicated: Secondary | ICD-10-CM | POA: Diagnosis not present

## 2021-09-14 DIAGNOSIS — J4530 Mild persistent asthma, uncomplicated: Secondary | ICD-10-CM | POA: Diagnosis not present

## 2021-09-15 DIAGNOSIS — M4722 Other spondylosis with radiculopathy, cervical region: Secondary | ICD-10-CM | POA: Diagnosis not present

## 2021-09-28 DIAGNOSIS — Z961 Presence of intraocular lens: Secondary | ICD-10-CM | POA: Diagnosis not present

## 2021-10-20 DIAGNOSIS — G4733 Obstructive sleep apnea (adult) (pediatric): Secondary | ICD-10-CM | POA: Diagnosis not present

## 2021-10-22 DIAGNOSIS — G4733 Obstructive sleep apnea (adult) (pediatric): Secondary | ICD-10-CM | POA: Diagnosis not present

## 2021-10-29 DIAGNOSIS — R400 Somnolence: Secondary | ICD-10-CM | POA: Diagnosis not present

## 2021-10-29 DIAGNOSIS — F17210 Nicotine dependence, cigarettes, uncomplicated: Secondary | ICD-10-CM | POA: Diagnosis not present

## 2021-10-29 DIAGNOSIS — J4530 Mild persistent asthma, uncomplicated: Secondary | ICD-10-CM | POA: Diagnosis not present

## 2021-10-29 DIAGNOSIS — J301 Allergic rhinitis due to pollen: Secondary | ICD-10-CM | POA: Diagnosis not present

## 2021-10-29 DIAGNOSIS — G4733 Obstructive sleep apnea (adult) (pediatric): Secondary | ICD-10-CM | POA: Diagnosis not present

## 2021-11-21 DIAGNOSIS — G4733 Obstructive sleep apnea (adult) (pediatric): Secondary | ICD-10-CM | POA: Diagnosis not present

## 2021-11-30 DIAGNOSIS — D519 Vitamin B12 deficiency anemia, unspecified: Secondary | ICD-10-CM | POA: Diagnosis not present

## 2021-11-30 DIAGNOSIS — J449 Chronic obstructive pulmonary disease, unspecified: Secondary | ICD-10-CM | POA: Diagnosis not present

## 2021-11-30 DIAGNOSIS — E1169 Type 2 diabetes mellitus with other specified complication: Secondary | ICD-10-CM | POA: Diagnosis not present

## 2021-11-30 DIAGNOSIS — E785 Hyperlipidemia, unspecified: Secondary | ICD-10-CM | POA: Diagnosis not present

## 2021-11-30 DIAGNOSIS — G4733 Obstructive sleep apnea (adult) (pediatric): Secondary | ICD-10-CM | POA: Diagnosis not present

## 2021-11-30 DIAGNOSIS — F32A Depression, unspecified: Secondary | ICD-10-CM | POA: Diagnosis not present

## 2021-11-30 DIAGNOSIS — I10 Essential (primary) hypertension: Secondary | ICD-10-CM | POA: Diagnosis not present

## 2021-12-10 DIAGNOSIS — J301 Allergic rhinitis due to pollen: Secondary | ICD-10-CM | POA: Diagnosis not present

## 2021-12-10 DIAGNOSIS — G4733 Obstructive sleep apnea (adult) (pediatric): Secondary | ICD-10-CM | POA: Diagnosis not present

## 2021-12-10 DIAGNOSIS — F17210 Nicotine dependence, cigarettes, uncomplicated: Secondary | ICD-10-CM | POA: Diagnosis not present

## 2021-12-10 DIAGNOSIS — J4530 Mild persistent asthma, uncomplicated: Secondary | ICD-10-CM | POA: Diagnosis not present

## 2021-12-10 DIAGNOSIS — R400 Somnolence: Secondary | ICD-10-CM | POA: Diagnosis not present

## 2021-12-21 NOTE — Progress Notes (Deleted)
@Patient  ID: , male    DOB: [birth year], 66 y.o.   MRN: [id number]  No chief complaint on file.   Referring provider: [id number], MD  HPI: 66 year old male, current everyday smoker.  Past medical history significant for OSA and emphysema.  Patient of Dr. 73.   12/22/2021 Patient presents today for annual follow-up.          Allergies  Allergen Reactions   Niacin And Related     Flushing feeling     There is no immunization history on file for this patient.  Past Medical History:  Diagnosis Date   Anxiety    Arthritis    Closed fracture of dorsal (thoracic) vertebra without mention of spinal cord injury    Colon polyps    COPD (chronic obstructive pulmonary disease) (HCC)    Depression    Dizziness and giddiness    Emphysema lung (HCC)    GERD (gastroesophageal reflux disease)    Hyperlipidemia    Multiple gastric ulcers    Other specified disease of hair and hair follicles    Sleep apnea    Thoracic spine fracture (HCC)     Tobacco History: Social History   Tobacco Use  Smoking Status Every Day   Packs/day: 1.50   Years: 50.00   Total pack years: 75.00   Types: Cigarettes  Smokeless Tobacco Former   Types: Chew   Ready to quit: Not Answered Counseling given: Not Answered   Outpatient Medications Prior to Visit  Medication Sig Dispense Refill   acetaminophen (TYLENOL) 500 MG tablet Take 500 mg by mouth every 6 (six) hours as needed for mild pain.     albuterol (VENTOLIN HFA) 108 (90 Base) MCG/ACT inhaler Inhale 2 puffs into the lungs every 6 (six) hours as needed for wheezing or shortness of breath. 18 g 6   ALPRAZolam (XANAX) 0.25 MG tablet Take 0.25 mg by mouth at bedtime as needed for anxiety.     cetirizine (ZYRTEC) 10 MG tablet Take 10 mg by mouth daily as needed for allergies.     nitroGLYCERIN (NITROSTAT) 0.4 MG SL tablet Place 1 tablet (0.4 mg total) under the tongue every 5 (five) minutes as needed for chest  pain. 25 tablet 1   pantoprazole (PROTONIX) 40 MG tablet Take 40 mg by mouth daily.     PARoxetine (PAXIL) 20 MG tablet Take 40 mg by mouth daily. *will take 10-20 mg once daily depending on how patient feels*     valsartan (DIOVAN) 160 MG tablet Take 160 mg by mouth daily.     No facility-administered medications prior to visit.      Review of Systems  Review of Systems   Physical Exam  There were no vitals taken for this visit. Physical Exam   Lab Results:  CBC    Component Value Date/Time   WBC 7.3 10/04/2013 0945   RBC 4.36 10/04/2013 0945   HGB 13.6 10/04/2013 0945   HCT 40.0 10/04/2013 0945   PLT 267.0 10/04/2013 0945   MCV 91.8 10/04/2013 0945   MCHC 34.0 10/04/2013 0945   RDW 14.5 10/04/2013 0945   LYMPHSABS 2.0 10/04/2013 0945   MONOABS 0.5 10/04/2013 0945   EOSABS 0.1 10/04/2013 0945   BASOSABS 0.0 10/04/2013 0945    BMET    Component Value Date/Time   NA 138 11/12/2019 1538   K 3.9 11/12/2019 1538   CL 104 11/12/2019 1538   CO2 28 11/12/2019 1538   GLUCOSE  95 11/12/2019 1538   BUN 11 11/12/2019 1538   CREATININE 1.01 11/12/2019 1538   CALCIUM 9.2 11/12/2019 1538    BNP No results found for: "BNP"  ProBNP    Component Value Date/Time   PROBNP 32.0 11/12/2019 1538    Imaging: No results found.   Assessment & Plan:   No problem-specific Assessment & Plan notes found for this encounter.     Glenford Bayley, NP 12/21/2021

## 2021-12-22 ENCOUNTER — Ambulatory Visit: Payer: Medicare Other | Admitting: Primary Care

## 2021-12-22 DIAGNOSIS — G4733 Obstructive sleep apnea (adult) (pediatric): Secondary | ICD-10-CM | POA: Diagnosis not present

## 2022-01-21 DIAGNOSIS — G4733 Obstructive sleep apnea (adult) (pediatric): Secondary | ICD-10-CM | POA: Diagnosis not present

## 2022-02-08 DIAGNOSIS — R002 Palpitations: Secondary | ICD-10-CM | POA: Diagnosis not present

## 2022-02-08 DIAGNOSIS — G4733 Obstructive sleep apnea (adult) (pediatric): Secondary | ICD-10-CM | POA: Diagnosis not present

## 2022-02-21 DIAGNOSIS — G4733 Obstructive sleep apnea (adult) (pediatric): Secondary | ICD-10-CM | POA: Diagnosis not present

## 2022-02-26 NOTE — Progress Notes (Signed)
CARDIOLOGY CONSULT NOTE       Patient ID: Jared Bryan MRN: [id number] DOB/AGE: [birth year] 69 y.o.  Admit date: (Not on file) Referring Physician: Delena Bali Primary Physician: Nicoletta Dress, MD Primary Cardiologist: New Reason for Consultation: palpitations   Active Problems:   * No active hospital problems. *   HPI:  67 y.o. referred by Dr Delena Bali for palpitations.  History of anxiety, GERD, HLD, gastric ulcers OSA and emphysema He is a smoker with 1.5 ppd for over 50 years Has seen [facility] Dr Elsworth Soho most recently 12/22/20 New onset rapid tachycardia. Usually at night Can last 15 minutes to an hour Frequency 4x/week Fluttering sensation with associated chest tightness and dyspnea. 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   ROS All other systems reviewed and negative except as noted above  Past Medical History:  Diagnosis Date   Anxiety    Arthritis    Closed fracture of dorsal (thoracic) vertebra without mention of spinal cord injury    Colon polyps    COPD (chronic obstructive pulmonary disease) (HCC)    Depression    Dizziness and giddiness    Emphysema lung (HCC)    GERD (gastroesophageal reflux disease)    Hyperlipidemia    Multiple gastric ulcers    Other specified disease of hair and hair follicles    Sleep apnea    Thoracic spine fracture ([place])     Family History  Problem Relation Age of Onset   Heart disease Father    Hypertension Father    Hypercholesterolemia Father    Diabetes Father    Heart attack Father    Lung  cancer Paternal Grandfather    Diabetes Maternal Grandmother     Social History   Socioeconomic History   Marital status: Married    Spouse name: Not on file   Number of children: 1   Years of education: Not on file   Highest education level: Not on file  Occupational History   Occupation: Leisure centre manager  Tobacco Use   Smoking status: Every Day    Packs/day: 1.50    Years: 50.00    Total pack years: 75.00    Types: Cigarettes   Smokeless tobacco: Former    Types: Chew  Substance and Sexual Activity   Alcohol use: Yes    Alcohol/week: 0.0 standard drinks of alcohol   Drug use: No   Sexual activity: Not on file  Other Topics Concern   Not on file  Social History Narrative   Married and one son   Also employed Archivist   12 caffeinated drinks daily   11/04/2015   Social Determinants of Health   Financial Resource Strain: Not on file  Food Insecurity: Not on file  Transportation Needs: Not on file  Physical Activity: Not on file  Stress: Not on file  Social Connections: Not on file  Intimate Partner Violence: Not on file    Past Surgical History:  Procedure Laterality Date   ANAL  RECTAL MANOMETRY N/A 11/19/2015   Procedure: ANO RECTAL MANOMETRY;  Surgeon: Gatha Mayer, MD;  Location: [facility];  Service: Endoscopy;  Laterality: N/A;   APPENDECTOMY     CERVICAL FUSION     INGUINAL HERNIA REPAIR Left    with mesh   LEFT HEART CATHETERIZATION WITH CORONARY ANGIOGRAM N/A 10/11/2013   Procedure: LEFT HEART CATHETERIZATION WITH CORONARY ANGIOGRAM;  Surgeon: Josue Hector, MD;  Location: [facility] CATH LAB;  Service: Cardiovascular;  Laterality: N/A;      Current Outpatient Medications:    acetaminophen (TYLENOL) 500 MG tablet, Take 500 mg by mouth every 6 (six) hours as needed for mild pain., Disp: , Rfl:    albuterol (VENTOLIN HFA) 108 (90 Base) MCG/ACT inhaler, Inhale 2 puffs into the lungs every 6 (six) hours as needed for wheezing or shortness of breath.,  Disp: 18 g, Rfl: 6   ALPRAZolam (XANAX) 0.25 MG tablet, Take 0.25 mg by mouth at bedtime as needed for anxiety., Disp: , Rfl:    cetirizine (ZYRTEC) 10 MG tablet, Take 10 mg by mouth daily as needed for allergies., Disp: , Rfl:    nitroGLYCERIN (NITROSTAT) 0.4 MG SL tablet, Place 1 tablet (0.4 mg total) under the tongue every 5 (five) minutes as needed for chest pain., Disp: 25 tablet, Rfl: 1   pantoprazole (PROTONIX) 40 MG tablet, Take 40 mg by mouth daily., Disp: , Rfl:    PARoxetine (PAXIL) 20 MG tablet, Take 40 mg by mouth daily. *will take 10-20 mg once daily depending on how patient feels*, Disp: , Rfl:    valsartan (DIOVAN) 160 MG tablet, Take 160 mg by mouth daily., Disp: , Rfl:     Physical Exam: There were no vitals taken for this visit.    Affect appropriate Chronically ill overweight male  HEENT: normal Neck supple with no adenopathy JVP normal no bruits no thyromegaly Lungs COPD with exp wheezing  Heart:  S1/S2 no murmur, no rub, gallop or click PMI normal Abdomen: benighn, BS positve, no tenderness, no AAA no bruit.  No HSM or HJR Distal pulses intact with no bruits No edema Neuro non-focal Skin warm and dry No muscular weakness   Labs:   Lab Results  Component Value Date   WBC 7.3 10/04/2013   HGB 13.6 10/04/2013   HCT 40.0 10/04/2013   MCV 91.8 10/04/2013   PLT 267.0 10/04/2013   No results for input(s): "NA", "K", "CL", "CO2", "BUN", "CREATININE", "CALCIUM", "PROT", "BILITOT", "ALKPHOS", "ALT", "AST", "GLUCOSE" in the last 168 hours.  Invalid input(s): "LABALBU" No results found for: "CKTOTAL", "CKMB", "CKMBINDEX", "TROPONINI" No results found for: "CHOL" No results found for: "HDL" No results found for: "LDLCALC" No results found for: "TRIG" No results found for: "CHOLHDL" No results found for: "LDLDIRECT"    Radiology: No results found.  EKG: SR nonspecific ST changes    ASSESSMENT AND PLAN:   Palpitations:  needs active 14 day monitor for  start. Differential includes MAT from his COPD, SVT or PAF. Labs including TSH ok  will arrange after cath  COPD: major issue with ongoing smoking f/u [name] Needs lung cancer screening CT  GERD:  continue protonix  HTN:  continue Valsartan  Angina:  known moderate dx from prior cath crescendo symptoms with pain relieved with nitro Shared decision making favor cath tomorrow with Dr Martinique  Shared Decision Making/Informed Consent The risks [stroke (1 in 1000), death (1 in 38), kidney failure [usually temporary] (1 in 500), bleeding (1 in 200),  allergic reaction [possibly serious] (1 in 200)], benefits (diagnostic support and management of coronary artery disease) and alternatives of a cardiac catheterization were discussed in detail with Ms. Wanita Chamberlain and she is willing to proceed.   Cath Pre cath F/U post cath further testing pending results of ischemic evaluation  Will need lung cancer CT   F/U post testing  Signed: Charlton Haws 02/26/2022, 5:04 PM

## 2022-02-26 NOTE — H&P (View-Only) (Signed)
CARDIOLOGY CONSULT NOTE       Patient ID: LASHAWN ORREGO MRN: [id number] DOB/AGE: [birth year] 69 y.o.  Admit date: (Not on file) Referring Physician: Delena Bali Primary Physician: Nicoletta Dress, MD Primary Cardiologist: New Reason for Consultation: palpitations   Active Problems:   * No active hospital problems. *   HPI:  67 y.o. referred by Dr Delena Bali for palpitations.  History of anxiety, GERD, HLD, gastric ulcers OSA and emphysema He is a smoker with 1.5 ppd for over 50 years Has seen [facility] Dr Elsworth Soho most recently 12/22/20 New onset rapid tachycardia. Usually at night Can last 15 minutes to an hour Frequency 4x/week Fluttering sensation with associated chest tightness and dyspnea. 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   ROS All other systems reviewed and negative except as noted above  Past Medical History:  Diagnosis Date   Anxiety    Arthritis    Closed fracture of dorsal (thoracic) vertebra without mention of spinal cord injury    Colon polyps    COPD (chronic obstructive pulmonary disease) (HCC)    Depression    Dizziness and giddiness    Emphysema lung (HCC)    GERD (gastroesophageal reflux disease)    Hyperlipidemia    Multiple gastric ulcers    Other specified disease of hair and hair follicles    Sleep apnea    Thoracic spine fracture ([place])     Family History  Problem Relation Age of Onset   Heart disease Father    Hypertension Father    Hypercholesterolemia Father    Diabetes Father    Heart attack Father    Lung  cancer Paternal Grandfather    Diabetes Maternal Grandmother     Social History   Socioeconomic History   Marital status: Married    Spouse name: Not on file   Number of children: 1   Years of education: Not on file   Highest education level: Not on file  Occupational History   Occupation: Leisure centre manager  Tobacco Use   Smoking status: Every Day    Packs/day: 1.50    Years: 50.00    Total pack years: 75.00    Types: Cigarettes   Smokeless tobacco: Former    Types: Chew  Substance and Sexual Activity   Alcohol use: Yes    Alcohol/week: 0.0 standard drinks of alcohol   Drug use: No   Sexual activity: Not on file  Other Topics Concern   Not on file  Social History Narrative   Married and one son   Also employed Archivist   12 caffeinated drinks daily   11/04/2015   Social Determinants of Health   Financial Resource Strain: Not on file  Food Insecurity: Not on file  Transportation Needs: Not on file  Physical Activity: Not on file  Stress: Not on file  Social Connections: Not on file  Intimate Partner Violence: Not on file    Past Surgical History:  Procedure Laterality Date   ANAL  RECTAL MANOMETRY N/A 11/19/2015   Procedure: ANO RECTAL MANOMETRY;  Surgeon: Gatha Mayer, MD;  Location: [facility];  Service: Endoscopy;  Laterality: N/A;   APPENDECTOMY     CERVICAL FUSION     INGUINAL HERNIA REPAIR Left    with mesh   LEFT HEART CATHETERIZATION WITH CORONARY ANGIOGRAM N/A 10/11/2013   Procedure: LEFT HEART CATHETERIZATION WITH CORONARY ANGIOGRAM;  Surgeon: Josue Hector, MD;  Location: [facility] CATH LAB;  Service: Cardiovascular;  Laterality: N/A;      Current Outpatient Medications:    acetaminophen (TYLENOL) 500 MG tablet, Take 500 mg by mouth every 6 (six) hours as needed for mild pain., Disp: , Rfl:    albuterol (VENTOLIN HFA) 108 (90 Base) MCG/ACT inhaler, Inhale 2 puffs into the lungs every 6 (six) hours as needed for wheezing or shortness of breath.,  Disp: 18 g, Rfl: 6   ALPRAZolam (XANAX) 0.25 MG tablet, Take 0.25 mg by mouth at bedtime as needed for anxiety., Disp: , Rfl:    cetirizine (ZYRTEC) 10 MG tablet, Take 10 mg by mouth daily as needed for allergies., Disp: , Rfl:    nitroGLYCERIN (NITROSTAT) 0.4 MG SL tablet, Place 1 tablet (0.4 mg total) under the tongue every 5 (five) minutes as needed for chest pain., Disp: 25 tablet, Rfl: 1   pantoprazole (PROTONIX) 40 MG tablet, Take 40 mg by mouth daily., Disp: , Rfl:    PARoxetine (PAXIL) 20 MG tablet, Take 40 mg by mouth daily. *will take 10-20 mg once daily depending on how patient feels*, Disp: , Rfl:    valsartan (DIOVAN) 160 MG tablet, Take 160 mg by mouth daily., Disp: , Rfl:     Physical Exam: There were no vitals taken for this visit.    Affect appropriate Chronically ill overweight male  HEENT: normal Neck supple with no adenopathy JVP normal no bruits no thyromegaly Lungs COPD with exp wheezing  Heart:  S1/S2 no murmur, no rub, gallop or click PMI normal Abdomen: benighn, BS positve, no tenderness, no AAA no bruit.  No HSM or HJR Distal pulses intact with no bruits No edema Neuro non-focal Skin warm and dry No muscular weakness   Labs:   Lab Results  Component Value Date   WBC 7.3 10/04/2013   HGB 13.6 10/04/2013   HCT 40.0 10/04/2013   MCV 91.8 10/04/2013   PLT 267.0 10/04/2013   No results for input(s): "NA", "K", "CL", "CO2", "BUN", "CREATININE", "CALCIUM", "PROT", "BILITOT", "ALKPHOS", "ALT", "AST", "GLUCOSE" in the last 168 hours.  Invalid input(s): "LABALBU" No results found for: "CKTOTAL", "CKMB", "CKMBINDEX", "TROPONINI" No results found for: "CHOL" No results found for: "HDL" No results found for: "LDLCALC" No results found for: "TRIG" No results found for: "CHOLHDL" No results found for: "LDLDIRECT"    Radiology: No results found.  EKG: SR nonspecific ST changes    ASSESSMENT AND PLAN:   Palpitations:  needs active 14 day monitor for  start. Differential includes MAT from his COPD, SVT or PAF. Labs including TSH ok  will arrange after cath  COPD: major issue with ongoing smoking f/u [name] Needs lung cancer screening CT  GERD:  continue protonix  HTN:  continue Valsartan  Angina:  known moderate dx from prior cath crescendo symptoms with pain relieved with nitro Shared decision making favor cath tomorrow with Dr Martinique  Shared Decision Making/Informed Consent The risks [stroke (1 in 1000), death (1 in 38), kidney failure [usually temporary] (1 in 500), bleeding (1 in 200),  allergic reaction [possibly serious] (1 in 200)], benefits (diagnostic support and management of coronary artery disease) and alternatives of a cardiac catheterization were discussed in detail with Ms. Wanita Chamberlain and she is willing to proceed.   Cath Pre cath F/U post cath further testing pending results of ischemic evaluation  Will need lung cancer CT   F/U post testing  Signed: Charlton Haws 02/26/2022, 5:04 PM

## 2022-03-01 ENCOUNTER — Ambulatory Visit (INDEPENDENT_AMBULATORY_CARE_PROVIDER_SITE_OTHER): Payer: Medicare Other | Admitting: Pulmonary Disease

## 2022-03-01 ENCOUNTER — Encounter (HOSPITAL_BASED_OUTPATIENT_CLINIC_OR_DEPARTMENT_OTHER): Payer: Self-pay | Admitting: Pulmonary Disease

## 2022-03-01 VITALS — BP 132/82 | HR 79 | Temp 98.0°F | Ht 63.0 in | Wt 186.6 lb

## 2022-03-01 DIAGNOSIS — J432 Centrilobular emphysema: Secondary | ICD-10-CM | POA: Diagnosis not present

## 2022-03-01 DIAGNOSIS — G4733 Obstructive sleep apnea (adult) (pediatric): Secondary | ICD-10-CM

## 2022-03-01 NOTE — Assessment & Plan Note (Signed)
He does not want any inhalers.  Will obtain PFTs from Dr. Su Ley. Smoking cessation was again emphasized to him. We discussed low-dose CT scan screening but he is very concerned about findings of lung cancer and he "does not want to know".  He will discuss with his wife and let us know if he wants to proceed

## 2022-03-01 NOTE — Assessment & Plan Note (Signed)
BiPAP download was reviewed which shows excellent control of events and 13/8 cm with mild leak.  He is very compliant more than 6.5 hours every night without a single missed night.  BiPAP is only helped improve his daytime somnolence and fatigue  Weight loss encouraged, compliance with goal of at least 4-6 hrs every night is the expectation. Advised against medications with sedative side effects Cautioned against driving when sleepy - understanding that sleepiness will vary on a day to day basis

## 2022-03-01 NOTE — Patient Instructions (Signed)
BiPAP is working well  X Obtain PFTs/ sleep study from dr Alcide Clever

## 2022-03-01 NOTE — Progress Notes (Signed)
   Subjective:    Patient ID: Jared Bryan, male    DOB: 1955/02/17, 67 y.o.   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    Last office visit 12/2020. He continues to smoke 1 pack/day, arrives accompanied by his wife. He is upset because his BiPAP stopped working sometime last year, he did not call us but went to see Dr. Carren Rang in [place], had a new sleep study and was set up with a new BiPAP 13/8 this is working better.  He also had breathing test done there and was told that he does not have COPD but has "asthma".  He is very upset about this diagnosis. He reports upper abdominal pain and dyspnea on exertion, he has an upcoming appointment with cardiology to discuss, he has a previous history of CAD Significant tests/ events reviewed  Spirometry 02/2017 ratio of 81 and FEV1 of 77% >> mild restriction   07/2015 Spirometry - FEV1 of 68% with a ratio of 72 and FVC of 77% small airways were decreased at 45%   PSG 08/2015 AHI 57/h, corrected by BiPAP 12/8   Review of Systems Pt denies any significant  nasal congestion or excess secretions, fever, chills, sweats, unintended wt loss, pleuritic or exertional cp, orthopnea pnd or leg swelling.  Pt also denies any obvious fluctuation in symptoms with weather or environmental change or other alleviating or aggravating factors.    Pt denies any increase in rescue therapy over baseline, denies waking up needing it or having early am exacerbations or coughing/wheezing/ or dyspnea      Objective:   Physical Exam  Gen. Pleasant, obese, in no distress, normal affect ENT - no pallor,icterus, no post nasal drip, class 2-3 airway Neck: No JVD, no thyromegaly, no carotid bruits Lungs: no use of  accessory muscles, no dullness to percussion, decreased without rales or rhonchi  Cardiovascular: Rhythm regular, heart sounds  normal, no murmurs or gallops, no peripheral edema Abdomen: soft and non-tender, no hepatosplenomegaly, BS normal. Musculoskeletal: No deformities, no cyanosis or clubbing Neuro:  alert, non focal, no tremors       Assessment & Plan:

## 2022-03-08 ENCOUNTER — Other Ambulatory Visit: Payer: Self-pay | Admitting: Cardiovascular Disease

## 2022-03-08 ENCOUNTER — Ambulatory Visit: Payer: Medicare Other | Attending: Cardiovascular Disease | Admitting: Cardiovascular Disease

## 2022-03-08 ENCOUNTER — Encounter: Payer: Self-pay | Admitting: Cardiovascular Disease

## 2022-03-08 VITALS — BP 138/80 | HR 65 | Ht 63.0 in | Wt 185.6 lb

## 2022-03-08 DIAGNOSIS — E785 Hyperlipidemia, unspecified: Secondary | ICD-10-CM | POA: Diagnosis not present

## 2022-03-08 DIAGNOSIS — R072 Precordial pain: Secondary | ICD-10-CM | POA: Diagnosis not present

## 2022-03-08 DIAGNOSIS — I10 Essential (primary) hypertension: Secondary | ICD-10-CM | POA: Diagnosis not present

## 2022-03-08 DIAGNOSIS — I209 Angina pectoris, unspecified: Secondary | ICD-10-CM

## 2022-03-08 DIAGNOSIS — R002 Palpitations: Secondary | ICD-10-CM | POA: Diagnosis not present

## 2022-03-08 LAB — CBC WITH DIFFERENTIAL/PLATELET
Basophils Absolute: 0 10*3/uL (ref 0.0–0.2)
Basos: 0 %
EOS (ABSOLUTE): 0.1 10*3/uL (ref 0.0–0.4)
Eos: 2 %
Hematocrit: 45.8 % (ref 37.5–51.0)
Hemoglobin: 15.7 g/dL (ref 13.0–17.7)
Lymphocytes Absolute: 2.6 10*3/uL (ref 0.7–3.1)
Lymphs: 34 %
MCH: 30.6 pg (ref 26.6–33.0)
MCHC: 34.3 g/dL (ref 31.5–35.7)
MCV: 89 fL (ref 79–97)
Monocytes Absolute: 0.7 10*3/uL (ref 0.1–0.9)
Monocytes: 9 %
Neutrophils Absolute: 4.3 10*3/uL (ref 1.4–7.0)
Neutrophils: 55 %
Platelets: 280 10*3/uL (ref 150–450)
RBC: 5.13 x10E6/uL (ref 4.14–5.80)
RDW: 16 % — ABNORMAL HIGH (ref 11.6–15.4)
WBC: 7.7 10*3/uL (ref 3.4–10.8)

## 2022-03-08 LAB — BASIC METABOLIC PANEL
BUN/Creatinine Ratio: 13 (ref 10–24)
BUN: 11 mg/dL (ref 8–27)
CO2: 27 mmol/L (ref 20–29)
Calcium: 9.8 mg/dL (ref 8.6–10.2)
Chloride: 101 mmol/L (ref 96–106)
Creatinine, Ser: 0.86 mg/dL (ref 0.76–1.27)
Glucose: 81 mg/dL (ref 70–99)
Potassium: 4.2 mmol/L (ref 3.5–5.2)
Sodium: 139 mmol/L (ref 134–144)
eGFR: 94 mL/min/{1.73_m2} (ref >59)

## 2022-03-08 MED ORDER — SODIUM CHLORIDE 0.9% FLUSH: 3.0000 mL | Freq: Two times a day (BID) | INTRAVENOUS | Status: DC

## 2022-03-08 MED ORDER — NITROGLYCERIN 0.4 MG SL SUBL: 0.4000 mg | SUBLINGUAL_TABLET | SUBLINGUAL | 1 refills | Status: AC | PRN

## 2022-03-08 NOTE — Patient Instructions (Signed)
Medication Instructions:  Your physician recommends that you continue on your current medications as directed. Please refer to the Current Medication list given to you today.  *If you need a refill on your cardiac medications before your next appointment, please call your pharmacy*  Lab Work: Your physician recommends that you have lab work today. BMET and CBC  If you have labs (blood work) drawn today and your tests are completely normal, you will receive your results only by: [place] (if you have MyChart) OR A paper copy in the mail If you have any lab test that is abnormal or we need to change your treatment, we will call you to review the results.  Testing/Procedures: Your physician has requested that you have a cardiac catheterization. Cardiac catheterization is used to diagnose and/or treat various heart conditions. Doctors may recommend this procedure for a number of different reasons. The most common reason is to evaluate chest pain. Chest pain can be a symptom of coronary artery disease (CAD), and cardiac catheterization can show whether plaque is narrowing or blocking your heart's arteries. This procedure is also used to evaluate the valves, as well as measure the blood flow and oxygen levels in different parts of your heart. For further information please visit [email or web address]. Please follow instruction sheet, as given.  Follow-Up: At [facility], you and your health needs are our priority.  As part of our continuing mission to provide you with exceptional heart care, we have created designated Provider Care Teams.  These Care Teams include your primary Cardiologist (physician) and Advanced Practice Providers (APPs -  Physician Assistants and Nurse Practitioners) who all work together to provide you with the care you need, when you need it.  We recommend signing up for the patient portal called "MyChart".  Sign up information is provided on this After Visit  Summary.  MyChart is used to connect with patients for Virtual Visits (Telemedicine).  Patients are able to view lab/test results, encounter notes, upcoming appointments, etc.  Non-urgent messages can be sent to your provider as well.   To learn more about what you can do with MyChart, go to [email or web address].    Your next appointment:   2 weeks  Provider:   Jenkins Rouge, MD      [facility] [place], [place] [id number] [place] [address] Dept: [phone] Loc: [phone]  RUVEN CORRADI  03/08/2022  You are scheduled for a Cardiac Catheterization on Tuesday, February 6 with Dr. Peter Martinique.  1. Please arrive at the [facility] (Main Entrance A) at [facility]: [address] [place], [place] [address] at 7:00 am(This time is two hours before your procedure to ensure your preparation). Free valet parking service is available.   Special note: Every effort is made to have your procedure done on time. Please understand that emergencies sometimes delay scheduled procedures.  2. Diet: Do not eat solid foods after midnight.  The patient may have clear liquids until 5am upon the day of the procedure.  3. Labs: You will need to have blood drawn on Monday, February 5 at [facility] at [facility]. [address]  Open: 7:30am - 5pm    Phone: [phone]. You do not need to be fasting.  4. Medication instructions in preparation for your procedure:   Contrast Allergy: No  On the morning of your procedure,  take your Aspirin 81 mg and any morning medicines NOT listed above.  You may use sips of water.  5. Plan for one night stay--bring personal belongings. 6. Bring a current list of your medications and current insurance cards. 7. You MUST have a responsible person to drive you home. 8. Someone MUST be with you the first 24 hours  after you arrive home or your discharge will be delayed. 9. Please wear clothes that are easy to get on and off and wear slip-on shoes.  Thank you for allowing [place] to care for you!   --  Invasive Cardiovascular services

## 2022-03-09 ENCOUNTER — Encounter (HOSPITAL_COMMUNITY): Payer: Self-pay | Admitting: Cardiology

## 2022-03-09 ENCOUNTER — Ambulatory Visit (HOSPITAL_COMMUNITY)
Admission: RE | Admit: 2022-03-09 | Discharge: 2022-03-09 | Disposition: A | Discharge status: Home Health | Payer: Medicare Other | Attending: Cardiology | Admitting: Cardiology

## 2022-03-09 ENCOUNTER — Encounter (HOSPITAL_COMMUNITY)
Admission: RE | Disposition: A | Discharge status: Home Health | Payer: Self-pay | Source: Home / Self Care | Attending: Cardiology

## 2022-03-09 ENCOUNTER — Other Ambulatory Visit: Payer: Self-pay

## 2022-03-09 DIAGNOSIS — K219 Gastro-esophageal reflux disease without esophagitis: Secondary | ICD-10-CM | POA: Diagnosis not present

## 2022-03-09 DIAGNOSIS — J439 Emphysema, unspecified: Secondary | ICD-10-CM | POA: Insufficient documentation

## 2022-03-09 DIAGNOSIS — I10 Essential (primary) hypertension: Secondary | ICD-10-CM | POA: Insufficient documentation

## 2022-03-09 DIAGNOSIS — Z8249 Family history of ischemic heart disease and other diseases of the circulatory system: Secondary | ICD-10-CM | POA: Diagnosis not present

## 2022-03-09 DIAGNOSIS — I209 Angina pectoris, unspecified: Secondary | ICD-10-CM

## 2022-03-09 DIAGNOSIS — G4733 Obstructive sleep apnea (adult) (pediatric): Secondary | ICD-10-CM | POA: Insufficient documentation

## 2022-03-09 DIAGNOSIS — Z79899 Other long term (current) drug therapy: Secondary | ICD-10-CM | POA: Insufficient documentation

## 2022-03-09 DIAGNOSIS — F17210 Nicotine dependence, cigarettes, uncomplicated: Secondary | ICD-10-CM | POA: Diagnosis not present

## 2022-03-09 DIAGNOSIS — E785 Hyperlipidemia, unspecified: Secondary | ICD-10-CM | POA: Insufficient documentation

## 2022-03-09 DIAGNOSIS — I25119 Atherosclerotic heart disease of native coronary artery with unspecified angina pectoris: Secondary | ICD-10-CM

## 2022-03-09 DIAGNOSIS — R002 Palpitations: Secondary | ICD-10-CM | POA: Diagnosis not present

## 2022-03-09 HISTORY — PX: LEFT HEART CATH AND CORONARY ANGIOGRAPHY: CATH118249

## 2022-03-09 SURGERY — LEFT HEART CATH AND CORONARY ANGIOGRAPHY
Anesthesia: LOCAL

## 2022-03-09 MED ORDER — HEPARIN (PORCINE) IN NACL 1000-0.9 UT/500ML-% IV SOLN
INTRAVENOUS | Status: AC
  Filled 2022-03-09: qty 1000

## 2022-03-09 MED ORDER — HEPARIN SODIUM (PORCINE) 1000 UNIT/ML IJ SOLN
INTRAMUSCULAR | Status: DC | PRN
  Administered 2022-03-09: 4000 [IU] via INTRAVENOUS

## 2022-03-09 MED ORDER — IOHEXOL 350 MG/ML SOLN
INTRAVENOUS | Status: DC | PRN
  Administered 2022-03-09: 45 mL

## 2022-03-09 MED ORDER — HEPARIN (PORCINE) IN NACL 1000-0.9 UT/500ML-% IV SOLN
INTRAVENOUS | Status: DC | PRN
  Administered 2022-03-09 (×2): 500 mL

## 2022-03-09 MED ORDER — VERAPAMIL HCL 2.5 MG/ML IV SOLN
INTRAVENOUS | Status: AC
  Filled 2022-03-09: qty 2

## 2022-03-09 MED ORDER — ASPIRIN 81 MG PO CHEW: 81.0000 mg | CHEWABLE_TABLET | ORAL | Status: DC

## 2022-03-09 MED ORDER — ASPIRIN 81 MG PO CHEW
81.0000 mg | CHEWABLE_TABLET | ORAL | Status: AC
  Administered 2022-03-09: 81 mg via ORAL

## 2022-03-09 MED ORDER — FENTANYL CITRATE (PF) 100 MCG/2ML IJ SOLN
INTRAMUSCULAR | Status: DC | PRN
  Administered 2022-03-09: 25 ug via INTRAVENOUS

## 2022-03-09 MED ORDER — SODIUM CHLORIDE 0.9% FLUSH
3.0000 mL | INTRAVENOUS | Status: DC | PRN
  Administered 2022-03-09: 3 mL via INTRAVENOUS

## 2022-03-09 MED ORDER — LIDOCAINE HCL (PF) 1 % IJ SOLN
INTRAMUSCULAR | Status: AC
  Filled 2022-03-09: qty 30

## 2022-03-09 MED ORDER — MIDAZOLAM HCL 2 MG/2ML IJ SOLN
INTRAMUSCULAR | Status: AC
  Filled 2022-03-09: qty 2

## 2022-03-09 MED ORDER — MIDAZOLAM HCL 2 MG/2ML IJ SOLN
INTRAMUSCULAR | Status: DC | PRN
  Administered 2022-03-09: 1 mg via INTRAVENOUS

## 2022-03-09 MED ORDER — SODIUM CHLORIDE 0.9 % IV SOLN: 250.0000 mL | INTRAVENOUS | Status: DC | PRN

## 2022-03-09 MED ORDER — HEPARIN SODIUM (PORCINE) 1000 UNIT/ML IJ SOLN
INTRAMUSCULAR | Status: AC
  Filled 2022-03-09: qty 10

## 2022-03-09 MED ORDER — SODIUM CHLORIDE 0.9 % WEIGHT BASED INFUSION: 1.0000 mL/kg/h | INTRAVENOUS | Status: DC

## 2022-03-09 MED ORDER — SODIUM CHLORIDE 0.9 % WEIGHT BASED INFUSION
3.0000 mL/kg/h | INTRAVENOUS | Status: AC
  Administered 2022-03-09: 3 mL/kg/h via INTRAVENOUS

## 2022-03-09 MED ORDER — LIDOCAINE HCL (PF) 1 % IJ SOLN
INTRAMUSCULAR | Status: DC | PRN
  Administered 2022-03-09: 2 mL via INTRADERMAL

## 2022-03-09 MED ORDER — FENTANYL CITRATE (PF) 100 MCG/2ML IJ SOLN
INTRAMUSCULAR | Status: AC
  Filled 2022-03-09: qty 2

## 2022-03-09 SURGICAL SUPPLY — 10 items
CATH 5FR JL3.5 JR4 ANG PIG MP (CATHETERS) IMPLANT
DEVICE RAD COMP TR BAND LRG (VASCULAR PRODUCTS) IMPLANT
GLIDESHEATH SLEND SS 6F .021 (SHEATH) IMPLANT
GUIDEWIRE INQWIRE 1.5J.035X260 (WIRE) IMPLANT
INQWIRE 1.5J .035X260CM (WIRE) ×1
KIT HEART LEFT (KITS) ×1 IMPLANT
PACK CARDIAC CATHETERIZATION (CUSTOM PROCEDURE TRAY) ×1 IMPLANT
SYR MEDRAD MARK 7 150ML (SYRINGE) ×1 IMPLANT
TRANSDUCER W/STOPCOCK (MISCELLANEOUS) ×1 IMPLANT
TUBING CIL FLEX 10 FLL-RA (TUBING) ×1 IMPLANT

## 2022-03-09 NOTE — Interval H&P Note (Signed)
History and Physical Interval Note:  03/09/2022 9:27 AM  Jared Bryan  has presented today for surgery, with the diagnosis of chest pain.  The various methods of treatment have been discussed with the patient and family. After consideration of risks, benefits and other options for treatment, the patient has consented to  Procedure(s): LEFT HEART CATH AND CORONARY ANGIOGRAPHY (N/A) as a surgical intervention.  The patient's history has been reviewed, patient examined, no change in status, stable for surgery.  I have reviewed the patient's chart and labs.  Questions were answered to the patient's satisfaction.    Cath Lab Visit (complete for each Cath Lab visit)  Clinical Evaluation Leading to the Procedure:   ACS: Yes.    Non-ACS:    Anginal Classification: CCS III  Anti-ischemic medical therapy: No Therapy  Non-Invasive Test Results: No non-invasive testing performed  Prior CABG: No previous CABG       Collier Salina [facility] 03/09/2022 9:27 AM

## 2022-03-09 NOTE — Progress Notes (Signed)
Discharge instructions given verbally and in writing to pt and wife.  Both verbalize understanding and deny further questions.  Radial site unremark.  DDI.  Pt VSS

## 2022-03-12 MED FILL — Verapamil HCl IV Soln 2.5 MG/ML: INTRAVENOUS | Qty: 2 | Status: AC

## 2022-03-16 NOTE — Progress Notes (Signed)
Cardiology Office Note:    Date:  03/23/2022   ID:  Jared Bryan, DOB 1955-09-17, MRN [id number]  PCP:  Nicoletta Dress, MD  [facility] Cardiologist:  Jenkins Rouge, MD     Referring MD: Nicoletta Dress, MD   Chief Complaint:  Hospitalization Follow-up     History of Present Illness:   Jared Bryan is a 67 y.o. male with history of anxiety, HLD, gastric ulcers, OSA, COPD, history of normal stress test in 2014 normal cath 2015 normal Myoview 2019 normal TEE 2019.  Patient saw Dr. Johnsie Cancel 03/08/2022 on referral for palpitations associated with chest tightness and shortness of breath.  old nitroglycerin seemed to help.  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  Cardiac cath recommended and showed nonobstructive CAD.  14-day monitor recommended.     Patient comes in with his wife. Went over cath. He refuses a statin-has never tried taking one and doesn't want to hurt. He had a brief episode of slight pressure or burning. He denies any further chest pain, palpitations. Smokes 1-2 ppd. Says he just burns them up and doesn't inhale much of them. Would like to wear a monitor. Drinks a pot of coffee daily, mountain dew 2-6/week, decaf tea.       Past Medical History:  Diagnosis Date   Anxiety    Arthritis    Closed fracture of dorsal (thoracic) vertebra without mention of spinal cord injury    Colon polyps    COPD (chronic obstructive pulmonary disease) (HCC)    Depression    Dizziness and giddiness    Emphysema lung (HCC)    GERD (gastroesophageal reflux disease)    Hyperlipidemia    Multiple gastric ulcers    Other specified disease of hair and hair follicles    Sleep apnea    Thoracic spine fracture (HCC)    Current Medications: Current Meds  Medication Sig   acetaminophen (TYLENOL) 500 MG tablet Take 500 mg by mouth every 6 (six) hours as needed for mild pain.   ALPRAZolam (XANAX) 0.25 MG tablet Take 0.25 mg by mouth at bedtime as needed  for anxiety.   cetirizine (ZYRTEC) 10 MG tablet Take 10 mg by mouth daily as needed for allergies.   nitroGLYCERIN (NITROSTAT) 0.4 MG SL tablet Place 1 tablet (0.4 mg total) under the tongue every 5 (five) minutes as needed for chest pain.   pantoprazole (PROTONIX) 40 MG tablet Take 40 mg by mouth daily.   rosuvastatin (CRESTOR) 10 MG tablet Take 1 tablet (10 mg total) by mouth once a week.   valsartan (DIOVAN) 320 MG tablet Take 320 mg by mouth daily.    Allergies:   Prednisone and Niacin and related   Social History   Tobacco Use   Smoking status: Every Day    Packs/day: 1.50    Years: 50.00    Total pack years: 75.00    Types: Cigarettes   Smokeless tobacco: Former    Types: Chew   Tobacco comments:    Pt states that sometimes he smoke 3 packs as of 03/01/2022 LW  Substance Use Topics   Alcohol use: Yes    Alcohol/week: 0.0 standard drinks of alcohol   Drug use: No    Family Hx: The patient's family history includes Diabetes in his father and maternal grandmother; Heart attack in his father; Heart disease in his father; Hypercholesterolemia in his father; Hypertension in his father; Lung cancer in his paternal grandfather.  ROS     Physical Exam:    VS:  BP 136/88   Pulse 67   Ht 5' 3"$  (1.6 m)   Wt 188 lb (85.3 kg)   SpO2 98%   BMI 33.30 kg/m     Wt Readings from Last 3 Encounters:  03/23/22 188 lb (85.3 kg)  03/09/22 185 lb (83.9 kg)  03/08/22 185 lb 9.6 oz (84.2 kg)    Physical Exam  GEN: Obese, in no acute distress  Neck: no JVD, carotid bruits, or masses Cardiac:RRR; no murmurs, rubs, or gallops  Respiratory:  clear to auscultation bilaterally, normal work of breathing GI: soft, nontender, nondistended, + BS Ext: right arm at cath size without hematoma or hemorrhage, good radial and brachial pulses. LE without cyanosis, clubbing, or edema, Good distal pulses bilaterally Neuro:  Alert and Oriented x 3,  Psych: euthymic mood, full affect         EKGs/Labs/Other Test Reviewed:    EKG:  EKG is  not ordered today.    Recent Labs: 03/08/2022: BUN 11; Creatinine, Ser 0.86; Hemoglobin 15.7; Platelets 280; Potassium 4.2; Sodium 139   Recent Lipid Panel No results for input(s): "CHOL", "TRIG", "HDL", "VLDL", "LDLCALC", "LDLDIRECT" in the last 8760 hours.   Prior CV Studies:   Cardiac cath 03/09/2022   Prox LAD to Mid LAD lesion is 50% stenosed.   Prox RCA lesion is 20% stenosed.   The left ventricular systolic function is normal.   LV end diastolic pressure is mildly elevated.   The left ventricular ejection fraction is 55-65% by visual estimate.   Nonobstructive CAD Normal LV function Mildly elevated LVEDP 19 mm Hg   Plan: medical therapy.     Risk Assessment/Calculations/Metrics:              ASSESSMENT & PLAN:   No problem-specific Assessment & Plan notes found for this encounter.   Chest pain cardiac cath 03/09/2022 nonobstructive CAD 50% proximal to mid LAD and 20% RCA normal LVEF medical therapy recommended. Patient agreeable to try statin one day a week. Will add rosuvastatin 10 mg daily. Smoking cessation discussed.  Palpitations 14-day monitor recommended. He hasn't had any recent palpitations but wants to wear one. Decrease caffeine intake.  Hypertension controlledon valsartan  COPD with ongoing tobacco use- followed by Dr. Karl Bales smoking cessation discussed  GERD  HLD LDL 116- add rosuvastatin once a week recheck in 3 months.            Dispo:  No follow-ups on file.   Medication Adjustments/Labs and Tests Ordered: Current medicines are reviewed at length with the patient today.  Concerns regarding medicines are outlined above.  Tests Ordered: Orders Placed This Encounter  Procedures   Lipid panel   LONG TERM MONITOR (3-14 DAYS)   Medication Changes: Meds ordered this encounter  Medications   rosuvastatin (CRESTOR) 10 MG tablet    Sig: Take 1 tablet (10 mg total) by mouth once a week.     Dispense:  12 tablet    Refill:  3   Signed, Ermalinda Barrios, PA-C  03/23/2022 11:11 AM    [facility] [place] [place], [place], [place]  [address] Phone: [phone]; Fax: [phone]

## 2022-03-23 ENCOUNTER — Ambulatory Visit (INDEPENDENT_AMBULATORY_CARE_PROVIDER_SITE_OTHER): Payer: Medicare Other

## 2022-03-23 ENCOUNTER — Ambulatory Visit: Payer: Medicare Other | Attending: Physician Assistant | Admitting: Physician Assistant

## 2022-03-23 ENCOUNTER — Encounter: Payer: Self-pay | Admitting: Physician Assistant

## 2022-03-23 VITALS — BP 136/88 | HR 67 | Ht 63.0 in | Wt 188.0 lb

## 2022-03-23 DIAGNOSIS — G4733 Obstructive sleep apnea (adult) (pediatric): Secondary | ICD-10-CM

## 2022-03-23 DIAGNOSIS — R0789 Other chest pain: Secondary | ICD-10-CM

## 2022-03-23 DIAGNOSIS — R002 Palpitations: Secondary | ICD-10-CM

## 2022-03-23 DIAGNOSIS — J438 Other emphysema: Secondary | ICD-10-CM | POA: Diagnosis not present

## 2022-03-23 DIAGNOSIS — E7849 Other hyperlipidemia: Secondary | ICD-10-CM | POA: Diagnosis not present

## 2022-03-23 DIAGNOSIS — Z720 Tobacco use: Secondary | ICD-10-CM

## 2022-03-23 MED ORDER — ROSUVASTATIN CALCIUM 10 MG PO TABS: 10.0000 mg | ORAL_TABLET | ORAL | 3 refills | Status: DC

## 2022-03-23 NOTE — Patient Instructions (Signed)
Medication Instructions:  Start Rosuvastatin (Crestor) 10 mg once a week   *If you need a refill on your cardiac medications before your next appointment, please call your pharmacy*   Lab Work: Your physician recommends that you return for a FASTING lipid profile in 3 months   If you have labs (blood work) drawn today and your tests are completely normal, you will receive your results only by: [place] (if you have MyChart) OR A paper copy in the mail If you have any lab test that is abnormal or we need to change your treatment, we will call you to review the results.   Testing/Procedures: A zio monitor was ordered today. It will remain on for 14 days. You will then return monitor and event diary in provided box. It takes 1-2 weeks for report to be downloaded and returned to [place]. We will call you with the results. If monitor falls off or has orange flashing light, please call Z[employer] for further instructions.     Follow-Up: At [facility], you and your health needs are our priority.  As part of our continuing mission to provide you with exceptional heart care, we have created designated Provider Care Teams.  These Care Teams include your primary Cardiologist (physician) and Advanced Practice Providers (APPs -  Physician Assistants and Nurse Practitioners) who all work together to provide you with the care you need, when you need it.  We recommend signing up for the patient portal called "MyChart".  Sign up information is provided on this After Visit Summary.  MyChart is used to connect with patients for Virtual Visits (Telemedicine).  Patients are able to view lab/test results, encounter notes, upcoming appointments, etc.  Non-urgent messages can be sent to your provider as well.   To learn more about what you can do with MyChart, go to [email or web address].    Your next appointment:   3 month(s)  Provider:   Jenkins Rouge, MD     Other Instructions Bryn Gulling- Long Term  Monitor Instructions  Your physician has requested you wear a ZIO patch monitor for 14 days.  This is a single patch monitor. [employer] supplies one patch monitor per enrollment. Additional stickers are not available. Please do not apply patch if you will be having a Nuclear Stress Test,  Echocardiogram, Cardiac CT, MRI, or Chest Xray during the period you would be wearing the  monitor. The patch cannot be worn during these tests. You cannot remove and re-apply the  ZIO XT patch monitor.  Your ZIO patch monitor will be mailed 3 day USPS to your address on file. It may take 3-5 days  to receive your monitor after you have been enrolled.  Once you have received your monitor, please review the enclosed instructions. Your monitor  has already been registered assigning a specific monitor serial # to you.  Billing and Patient Assistance Program Information  We have supplied [employer] with any of your insurance information on file for billing purposes. [employer] offers a sliding scale Patient Assistance Program for patients that do not have  insurance, or whose insurance does not completely cover the cost of the ZIO monitor.  You must apply for the Patient Assistance Program to qualify for this discounted rate.  To apply, please call [employer] at [phone], select option 4, select option 2, ask to apply for  Patient Assistance Program. Theodore Demark will ask your household income, and how many people  are in your household. They will quote your out-of-pocket cost  based on that information.  [employer] will also be able to set up a 38-month interest-free payment plan if needed.  Applying the monitor   Shave hair from upper left chest.  Hold abrader disc by orange tab. Rub abrader in 40 strokes over the upper left chest as  indicated in your monitor instructions.  Clean area with 4 enclosed alcohol pads. Let dry.  Apply patch as indicated in monitor instructions. Patch will be placed under collarbone on left   side of chest with arrow pointing upward.  Rub patch adhesive wings for 2 minutes. Remove white label marked "1". Remove the white  label marked "2". Rub patch adhesive wings for 2 additional minutes.  While looking in a mirror, press and release button in center of patch. A small green light will  flash 3-4 times. This will be your only indicator that the monitor has been turned on.  Do not shower for the first 24 hours. You may shower after the first 24 hours.  Press the button if you feel a symptom. You will hear a small click. Record Date, Time and  Symptom in the Patient Logbook.  When you are ready to remove the patch, follow instructions on the last 2 pages of Patient  Logbook. Stick patch monitor onto the last page of Patient Logbook.  Place Patient Logbook in the blue and white box. Use locking tab on box and tape box closed  securely. The blue and white box has prepaid postage on it. Please place it in the mailbox as  soon as possible. Your physician should have your test results approximately 7 days after the  monitor has been [facility]  Call [place] [phone] you have questions regarding  your ZIO XT patch monitor. Call them immediately if you see an orange light blinking on your  monitor.  If your monitor falls off in less than 4 days, contact our Monitor department at [phone]  If your monitor becomes loose or falls off after 4 days call [employer] at [phone]  suggestions on securing your monitor

## 2022-03-23 NOTE — Progress Notes (Unsigned)
Applied a 14 day Zio XT monitor to patient in the office  Dr Johnsie Cancel to read

## 2022-03-24 DIAGNOSIS — G4733 Obstructive sleep apnea (adult) (pediatric): Secondary | ICD-10-CM | POA: Diagnosis not present

## 2022-04-08 DIAGNOSIS — R002 Palpitations: Secondary | ICD-10-CM | POA: Diagnosis not present

## 2022-04-22 DIAGNOSIS — G4733 Obstructive sleep apnea (adult) (pediatric): Secondary | ICD-10-CM | POA: Diagnosis not present

## 2022-05-06 IMAGING — DX DG CHEST 2V
2 series · 2 of 2 positions shown · non-contrast
Comparison: Chest radiograph December 02, 2016.

CLINICAL DATA: Dyspnea

EXAM:
CHEST - 2 VIEW

[chest pa]
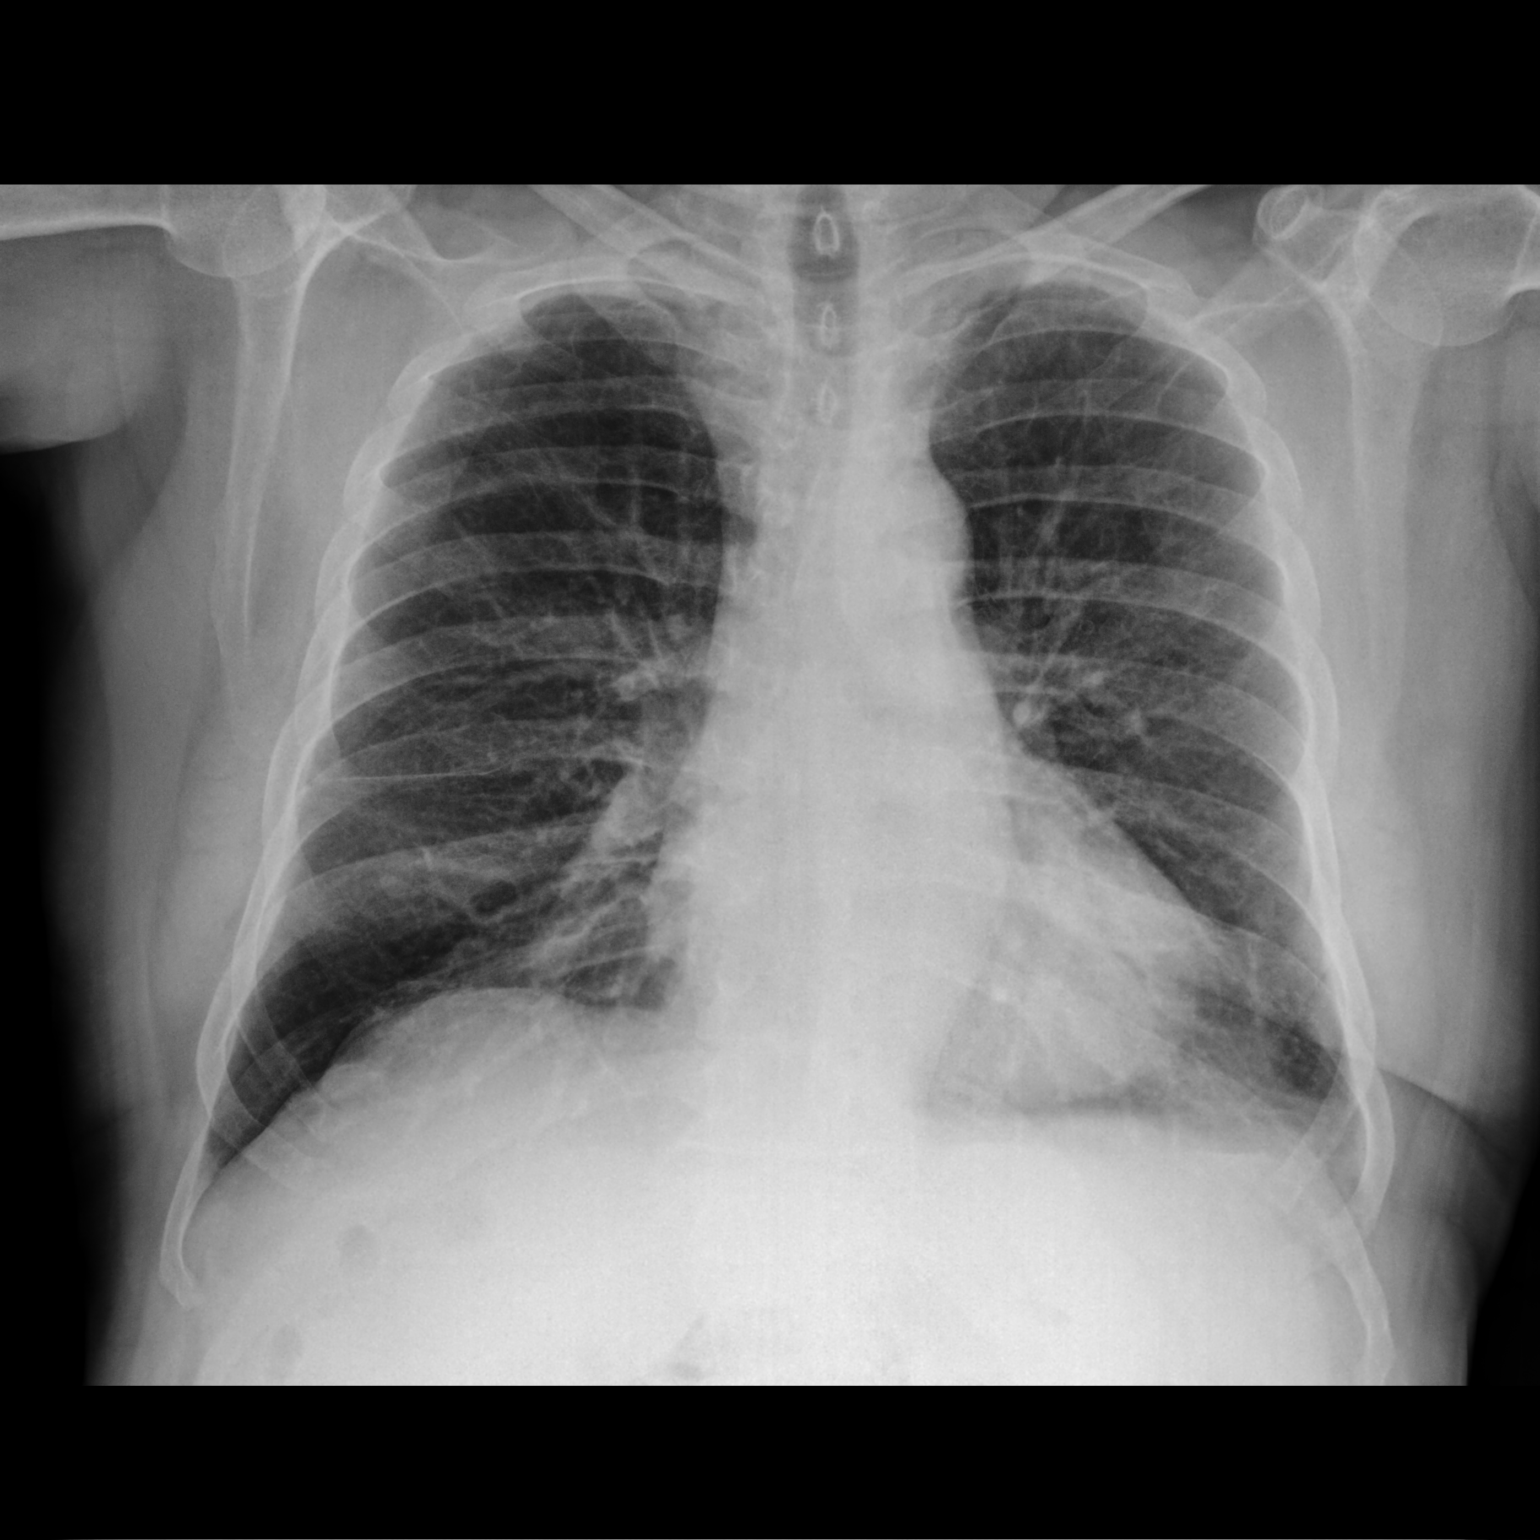

[chest lat]
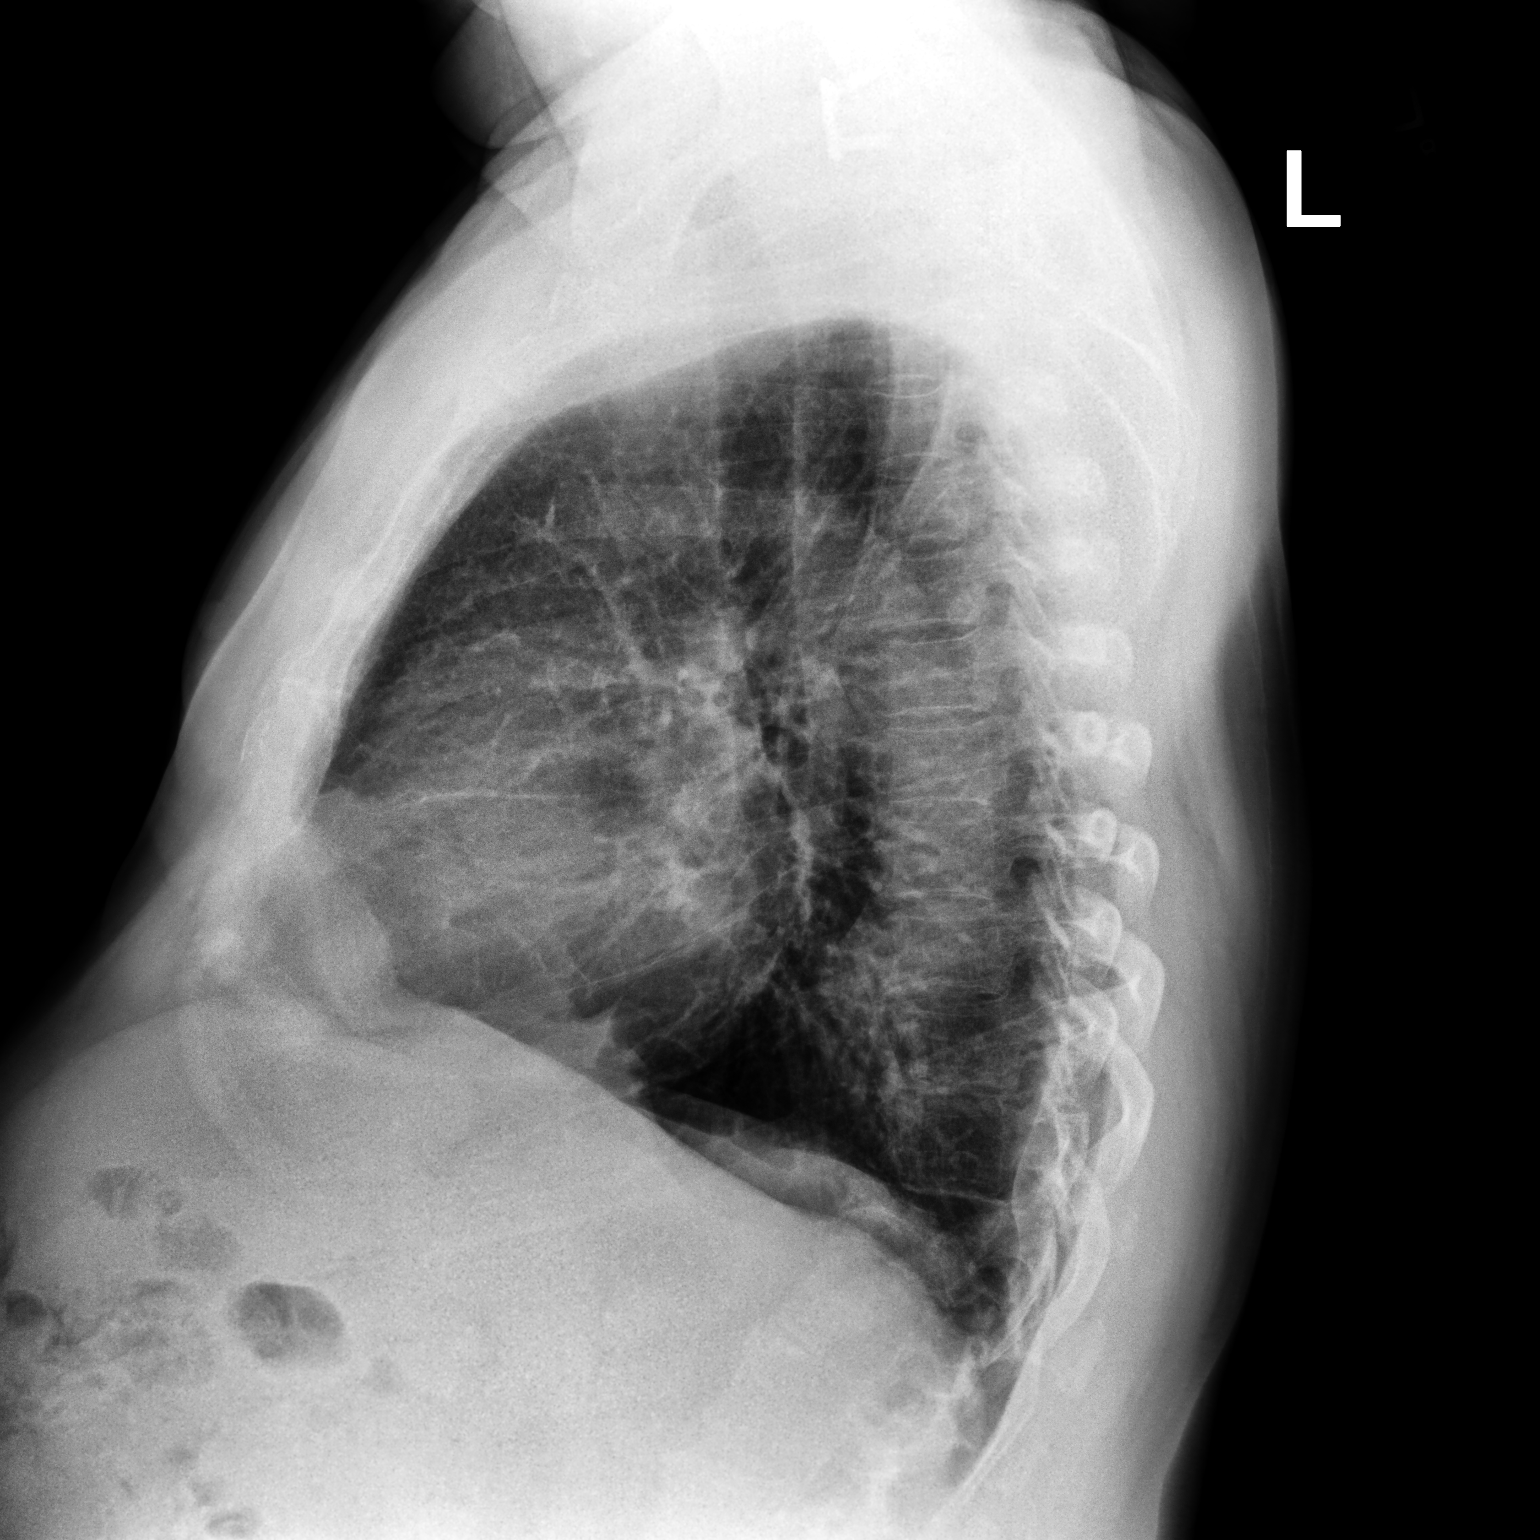

[2 of 2 positions shown; findings below may reference images not displayed]

FINDINGS: The heart size and mediastinal contours are within normal limits.
Chronic parenchymal lung changes. No focal consolidation or overt
pulmonary edema. Thoracic spondylosis and degenerative changes of
bilateral AC joints. Anterior cervical fusion hardware.
IMPRESSION: No active cardiopulmonary disease.

## 2022-05-07 DIAGNOSIS — Z125 Encounter for screening for malignant neoplasm of prostate: Secondary | ICD-10-CM | POA: Diagnosis not present

## 2022-05-07 DIAGNOSIS — D519 Vitamin B12 deficiency anemia, unspecified: Secondary | ICD-10-CM | POA: Diagnosis not present

## 2022-05-07 DIAGNOSIS — E1169 Type 2 diabetes mellitus with other specified complication: Secondary | ICD-10-CM | POA: Diagnosis not present

## 2022-05-07 DIAGNOSIS — F419 Anxiety disorder, unspecified: Secondary | ICD-10-CM | POA: Diagnosis not present

## 2022-05-07 DIAGNOSIS — I10 Essential (primary) hypertension: Secondary | ICD-10-CM | POA: Diagnosis not present

## 2022-05-07 DIAGNOSIS — E785 Hyperlipidemia, unspecified: Secondary | ICD-10-CM | POA: Diagnosis not present

## 2022-06-09 NOTE — Progress Notes (Signed)
Cardiology Office Note:    Date:  06/16/2022   ID:  [name], DOB 30-Jul-1955, MRN [id number]  PCP:  Paulina Fusi, MD  [facility] Cardiologist:  Charlton Haws, MD     Referring MD: Paulina Fusi, MD    History of Present Illness:   Jared Bryan is a 67 y.o. male with history of anxiety, HLD, gastric ulcers, OSA, COPD, history of normal stress test in 2014 normal cath 2015 normal Myoview 2019 normal TEE 2019.  Patient saw me 03/08/2022 on referral for palpitations associated with chest tightness and shortness of breath.  old nitroglycerin seemed to help.  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  Cardiac cath recommended and showed nonobstructive CAD.  14-day monitor recommended.     He refuses a statin-has never tried taking one and doesn't want to hurt.  Smokes 1-2 ppd. Says he just burns them up and doesn't inhale much of them. Drinks a pot of coffee daily, mountain dew 2-6/week, decaf tea.   Monitor:  04/11/22 reviewed average HR 73 bpm SVT limited max 13 beats Wenkebach with PAC/PVC < 1% total beats   Uses Bipap for OSA, Prior ETOH abuse Retired Designer, fashion/clothing   He cannot take any statin with severe myalgias discussed alternatives and for now willing to start Zetia     Past Medical History:  Diagnosis Date   Anxiety    Arthritis    Closed fracture of dorsal (thoracic) vertebra without mention of spinal cord injury    Colon polyps    COPD (chronic obstructive pulmonary disease) (HCC)    Depression    Dizziness and giddiness    Emphysema lung (HCC)    GERD (gastroesophageal reflux disease)    Hyperlipidemia    Multiple gastric ulcers    Other specified disease of hair and hair follicles    Sleep apnea    Thoracic spine fracture (HCC)    Current Medications: Current Meds  Medication Sig   acetaminophen (TYLENOL) 500 MG tablet Take 500 mg by mouth every 6 (six) hours as needed for mild pain.   ALPRAZolam (XANAX) 0.25 MG tablet Take  0.25 mg by mouth at bedtime as needed for anxiety.   cetirizine (ZYRTEC) 10 MG tablet Take 10 mg by mouth daily as needed for allergies.   nitroGLYCERIN (NITROSTAT) 0.4 MG SL tablet Place 1 tablet (0.4 mg total) under the tongue every 5 (five) minutes as needed for chest pain.   pantoprazole (PROTONIX) 40 MG tablet Take 40 mg by mouth daily.   valsartan (DIOVAN) 320 MG tablet Take 320 mg by mouth daily.    Allergies:   Crestor [rosuvastatin], Prednisone, and Niacin and related   Social History   Tobacco Use   Smoking status: Every Day    Packs/day: 1.50    Years: 50.00    Additional pack years: 0.00    Total pack years: 75.00    Types: Cigarettes   Smokeless tobacco: Former    Types: Chew   Tobacco comments:    Pt states that sometimes he smoke 3 packs as of 03/01/2022 LW  Substance Use Topics   Alcohol use: Yes    Alcohol/week: 0.0 standard drinks of alcohol   Drug use: No    Family Hx: The patient's family history includes Diabetes in his father and maternal grandmother; Heart attack in his father; Heart disease in his father; Hypercholesterolemia in his father; Hypertension in his father; Lung cancer in his paternal  grandfather.  ROS     Physical Exam:    VS:  BP 124/70   Pulse 60   Ht 5\' 3"  (1.6 m)   Wt 178 lb 9.6 oz (81 kg)   SpO2 94%   BMI 31.64 kg/m     Wt Readings from Last 3 Encounters:  06/16/22 178 lb 9.6 oz (81 kg)  03/23/22 188 lb (85.3 kg)  03/09/22 185 lb (83.9 kg)    Physical Exam  GEN: Obese, in no acute distress  Neck: no JVD, carotid bruits, or masses Cardiac:RRR; no murmurs, rubs, or gallops  Respiratory:  clear to auscultation bilaterally, normal work of breathing GI: soft, nontender, nondistended, + BS Ext: right arm at cath size without hematoma or hemorrhage, good radial and brachial pulses. LE without cyanosis, clubbing, or edema, Good distal pulses bilaterally Neuro:  Alert and Oriented x 3,  Psych: euthymic mood, full affect         EKGs/Labs/Other Test Reviewed:    EKG:  EKG is  not ordered today.    Recent Labs: 03/08/2022: BUN 11; Creatinine, Ser 0.86; Hemoglobin 15.7; Platelets 280; Potassium 4.2; Sodium 139   Recent Lipid Panel No results for input(s): "CHOL", "TRIG", "HDL", "VLDL", "LDLCALC", "LDLDIRECT" in the last 8760 hours.   Prior CV Studies:   Cardiac cath 03/09/2022   Prox LAD to Mid LAD lesion is 50% stenosed.   Prox RCA lesion is 20% stenosed.   The left ventricular systolic function is normal.   LV end diastolic pressure is mildly elevated.   The left ventricular ejection fraction is 55-65% by visual estimate.   Nonobstructive CAD Normal LV function Mildly elevated LVEDP 19 mm Hg   Plan: medical therapy.      ASSESSMENT & PLAN:    Chest pain cardiac cath 03/09/2022 nonobstructive CAD 50% proximal to mid LAD and 20% RCA normal LVEF medical therapy recommended. Patient agreeable to try statin one day a week. Will add rosuvastatin 10 mg daily. Smoking cessation discussed.  Palpitations nothing Rx on monitor observe   Hypertension controlled on valsartan  COPD with ongoing tobacco use- followed by Dr. Pernell Dupre smoking cessation discussed Lung cancer CT ordered   GERD low carb diet continue Protonix   HLD LDL 116-  intolerant to statins start Zetia 10 mg daily   OSA:  continue CPAP  Lipid/Liver Lung cancer CT       Dispo:  F/U in a year   Medication Adjustments/Labs and Tests Ordered: Current medicines are reviewed at length with the patient today.  Concerns regarding medicines are outlined above.  Tests Ordered: No orders of the defined types were placed in this encounter.  Medication Changes: No orders of the defined types were placed in this encounter.  Signed, Charlton Haws, MD  06/16/2022 10:44 AM    [facility] [address] [place], [place], [place]  [address] Phone: [phone]; Fax: [phone]

## 2022-06-16 ENCOUNTER — Ambulatory Visit: Payer: Medicare HMO | Attending: Cardiovascular Disease | Admitting: Cardiovascular Disease

## 2022-06-16 ENCOUNTER — Encounter: Payer: Self-pay | Admitting: Cardiovascular Disease

## 2022-06-16 ENCOUNTER — Other Ambulatory Visit: Payer: Self-pay | Admitting: Cardiovascular Disease

## 2022-06-16 ENCOUNTER — Other Ambulatory Visit: Payer: Self-pay | Admitting: *Deleted

## 2022-06-16 ENCOUNTER — Ambulatory Visit: Payer: Medicare HMO

## 2022-06-16 VITALS — BP 124/70 | HR 60 | Ht 63.0 in | Wt 178.6 lb

## 2022-06-16 DIAGNOSIS — E785 Hyperlipidemia, unspecified: Secondary | ICD-10-CM

## 2022-06-16 DIAGNOSIS — Z79899 Other long term (current) drug therapy: Secondary | ICD-10-CM | POA: Diagnosis not present

## 2022-06-16 DIAGNOSIS — I2510 Atherosclerotic heart disease of native coronary artery without angina pectoris: Secondary | ICD-10-CM | POA: Diagnosis not present

## 2022-06-16 DIAGNOSIS — Z720 Tobacco use: Secondary | ICD-10-CM | POA: Diagnosis not present

## 2022-06-16 LAB — LIPID PANEL
Chol/HDL Ratio: 6 ratio — ABNORMAL HIGH (ref 0.0–5.0)
Cholesterol, Total: 186 mg/dL (ref 100–199)
HDL: 31 mg/dL — ABNORMAL LOW (ref >39)
LDL Chol Calc (NIH): 127 mg/dL — ABNORMAL HIGH (ref 0–99)
Triglycerides: 155 mg/dL — ABNORMAL HIGH (ref 0–149)
VLDL Cholesterol Cal: 28 mg/dL (ref 5–40)

## 2022-06-16 LAB — HEPATIC FUNCTION PANEL
ALT: 30 IU/L (ref 0–44)
AST: 24 IU/L (ref 0–40)
Albumin: 4.2 g/dL (ref 3.9–4.9)
Alkaline Phosphatase: 86 IU/L (ref 44–121)
Bilirubin Total: 0.4 mg/dL (ref 0.0–1.2)
Bilirubin, Direct: <0.1 mg/dL (ref 0.00–0.40)
Total Protein: 6.5 g/dL (ref 6.0–8.5)

## 2022-06-16 MED ORDER — EZETIMIBE 10 MG PO TABS: 10.0000 mg | ORAL_TABLET | Freq: Every day | ORAL | 3 refills | Status: DC

## 2022-06-16 NOTE — Patient Instructions (Signed)
Medication Instructions:  Your physician has recommended you make the following change in your medication:   1) START ezetimibe (Zetia) 10mg  daily  *If you need a refill on your cardiac medications before your next appointment, please call your pharmacy*  Lab Work: TODAY: lipid panel and LFTs If you have labs (blood work) drawn today and your tests are completely normal, you will receive your results only by: MyChart Message (if you have MyChart) OR A paper copy in the mail If you have any lab test that is abnormal or we need to change your treatment, we will call you to review the results.  Testing/Procedures: Your physician has requested that you have a lung CT scan. Non-Cardiac CT scanning, (CAT scanning), is a noninvasive, special x-ray that produces cross-sectional images of the body using x-rays and a computer. CT scans help physicians diagnose and treat medical conditions. For some CT exams, a contrast material is used to enhance visibility in the area of the body being studied. CT scans provide greater clarity and reveal more details than regular x-ray exams.   Follow-Up: At [facility], you and your health needs are our priority.  As part of our continuing mission to provide you with exceptional heart care, we have created designated Provider Care Teams.  These Care Teams include your primary Cardiologist (physician) and Advanced Practice Providers (APPs -  Physician Assistants and Nurse Practitioners) who all work together to provide you with the care you need, when you need it.  Your next appointment:   6 months  The format for your next appointment:   In Person  Provider:   Charlton Haws, MD {

## 2022-06-21 ENCOUNTER — Other Ambulatory Visit: Payer: Medicare Other

## 2022-09-07 DIAGNOSIS — Z0000 Encounter for general adult medical examination without abnormal findings: Secondary | ICD-10-CM | POA: Diagnosis not present

## 2022-09-07 DIAGNOSIS — Z9181 History of falling: Secondary | ICD-10-CM | POA: Diagnosis not present

## 2022-09-09 DIAGNOSIS — M25552 Pain in left hip: Secondary | ICD-10-CM | POA: Diagnosis not present

## 2022-09-09 DIAGNOSIS — M25562 Pain in left knee: Secondary | ICD-10-CM | POA: Diagnosis not present

## 2022-09-09 DIAGNOSIS — M1612 Unilateral primary osteoarthritis, left hip: Secondary | ICD-10-CM | POA: Diagnosis not present

## 2022-10-13 DIAGNOSIS — H8113 Benign paroxysmal vertigo, bilateral: Secondary | ICD-10-CM | POA: Diagnosis not present

## 2022-10-27 DIAGNOSIS — M25552 Pain in left hip: Secondary | ICD-10-CM | POA: Diagnosis not present

## 2022-10-27 DIAGNOSIS — M1612 Unilateral primary osteoarthritis, left hip: Secondary | ICD-10-CM | POA: Diagnosis not present

## 2022-11-08 DIAGNOSIS — I2510 Atherosclerotic heart disease of native coronary artery without angina pectoris: Secondary | ICD-10-CM | POA: Diagnosis not present

## 2022-11-08 DIAGNOSIS — D519 Vitamin B12 deficiency anemia, unspecified: Secondary | ICD-10-CM | POA: Diagnosis not present

## 2022-11-08 DIAGNOSIS — R972 Elevated prostate specific antigen [PSA]: Secondary | ICD-10-CM | POA: Diagnosis not present

## 2022-11-08 DIAGNOSIS — G729 Myopathy, unspecified: Secondary | ICD-10-CM | POA: Diagnosis not present

## 2022-11-08 DIAGNOSIS — G4733 Obstructive sleep apnea (adult) (pediatric): Secondary | ICD-10-CM | POA: Diagnosis not present

## 2022-11-08 DIAGNOSIS — E1169 Type 2 diabetes mellitus with other specified complication: Secondary | ICD-10-CM | POA: Diagnosis not present

## 2022-11-08 DIAGNOSIS — E785 Hyperlipidemia, unspecified: Secondary | ICD-10-CM | POA: Diagnosis not present

## 2022-11-08 DIAGNOSIS — F419 Anxiety disorder, unspecified: Secondary | ICD-10-CM | POA: Diagnosis not present

## 2022-11-08 DIAGNOSIS — I10 Essential (primary) hypertension: Secondary | ICD-10-CM | POA: Diagnosis not present

## 2022-11-10 DIAGNOSIS — N400 Enlarged prostate without lower urinary tract symptoms: Secondary | ICD-10-CM | POA: Diagnosis not present

## 2022-11-10 DIAGNOSIS — R972 Elevated prostate specific antigen [PSA]: Secondary | ICD-10-CM | POA: Diagnosis not present

## 2022-11-22 ENCOUNTER — Telehealth: Payer: Self-pay | Admitting: Pulmonary Disease

## 2022-11-22 NOTE — Telephone Encounter (Signed)
PT is calling because he feels like he is not getting enough air from his CPAP. The setting recom by the sleep study was 813. We set it at 813 "variable". He'd like a call back to advise if we can adjust his settings.   [phone]

## 2022-11-23 NOTE — Telephone Encounter (Signed)
Lm x1 for patient.  

## 2022-11-23 NOTE — Telephone Encounter (Signed)
I spoke with the patient. He is concerned that his BiPAP settings are not right and his machine is not working properly. He wanted to be seen sooner the his appt in January. I have scheduled him an appt with Rubye Oaks, NP on 11/5 at 10:00am.  Nothing further needed.

## 2022-12-06 NOTE — Progress Notes (Unsigned)
Cardiology Office Note:    Date:  12/08/2022   ID:  Jared Bryan, DOB 1955/05/03, MRN [id number]  PCP:  Paulina Fusi, MD  [facility] Providers Cardiologist:  Charlton Haws, MD     Referring MD: Paulina Fusi, MD    History of Present Illness:   Jared Bryan is a 67 y.o. male with history of anxiety, HLD, gastric ulcers, OSA, COPD, history of normal stress test in 2014 normal cath 2015 normal Myoview 2019 normal TEE 2019.  Patient saw me 03/08/2022 on referral for palpitations associated with chest tightness and shortness of breath.  old nitroglycerin seemed to help.  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  Cardiac cath recommended and showed nonobstructive CAD.  14-day monitor recommended.     Smokes 1-2 ppd. Says he just burns them up and doesn't inhale much of them. Drinks a pot of coffee daily, mountain dew 2-6/week, decaf tea.   Monitor:  04/11/22 reviewed average HR 73 bpm SVT limited max 13 beats Wenkebach with PAC/PVC < 1% total beats   Uses Bipap for OSA, Prior ETOH abuse Retired Designer, fashion/clothing   He cannot take any statin with severe myalgias discussed alternatives and started on Zetia 06/16/22 At that time LDL was 127   Doing well likes to fish and has a mountain house Does not believe in any vaccines Has had a hard time getting new CPAP and getting appropriate settings [employer]      Past Medical History:  Diagnosis Date   Anxiety    Arthritis    Closed fracture of dorsal (thoracic) vertebra without mention of spinal cord injury    Colon polyps    COPD (chronic obstructive pulmonary disease) (HCC)    Depression    Dizziness and giddiness    Emphysema lung (HCC)    GERD (gastroesophageal reflux disease)    Hyperlipidemia    Multiple gastric ulcers    Other specified disease of hair and hair follicles    Sleep apnea    Thoracic spine fracture (HCC)    Current Medications: Current Meds  Medication Sig   acetaminophen (TYLENOL) 500 MG  tablet Take 500 mg by mouth every 6 (six) hours as needed for mild pain.   ALPRAZolam (XANAX) 0.25 MG tablet Take 0.25 mg by mouth at bedtime as needed for anxiety.   cetirizine (ZYRTEC) 10 MG tablet Take 10 mg by mouth daily as needed for allergies.   ezetimibe (ZETIA) 10 MG tablet Take 1 tablet (10 mg total) by mouth daily.   nitroGLYCERIN (NITROSTAT) 0.4 MG SL tablet Place 1 tablet (0.4 mg total) under the tongue every 5 (five) minutes as needed for chest pain.   pantoprazole (PROTONIX) 40 MG tablet Take 40 mg by mouth daily.   valsartan (DIOVAN) 320 MG tablet Take 320 mg by mouth daily.    Allergies:   Crestor [rosuvastatin], Prednisone, and Niacin and related   Social History   Tobacco Use   Smoking status: Every Day    Current packs/day: 1.50    Average packs/day: 1.5 packs/day for 50.0 years (75.0 ttl pk-yrs)    Types: Cigarettes   Smokeless tobacco: Former    Types: Chew   Tobacco comments:    Pt states that sometimes he smoke 1-3 packs (depends on what he is doing).  Takes a few puffs and the throws it away.  12/07/2022 hfb  Substance Use Topics   Alcohol use: Yes    Alcohol/week: 0.0  standard drinks of alcohol   Drug use: No    Family Hx: The patient's family history includes Diabetes in his father and maternal grandmother; Heart attack in his father; Heart disease in his father; Hypercholesterolemia in his father; Hypertension in his father; Lung cancer in his paternal grandfather.  ROS     Physical Exam:    VS:  BP 132/80   Pulse 75   Ht 5\' 4"  (1.626 m)   Wt 181 lb 12.8 oz (82.5 kg)   SpO2 98%   BMI 31.21 kg/m     Wt Readings from Last 3 Encounters:  12/08/22 181 lb 12.8 oz (82.5 kg)  12/07/22 181 lb 9.6 oz (82.4 kg)  06/16/22 178 lb 9.6 oz (81 kg)    Physical Exam  GEN: Obese, in no acute distress  Neck: no JVD, carotid bruits, or masses Cardiac:RRR; no murmurs, rubs, or gallops  Respiratory:  clear to auscultation bilaterally, normal work of  breathing GI: soft, nontender, nondistended, + BS Ext: right arm at cath size without hematoma or hemorrhage, good radial and brachial pulses. LE without cyanosis, clubbing, or edema, Good distal pulses bilaterally Neuro:  Alert and Oriented x 3,  Psych: euthymic mood, full affect        EKGs/Labs/Other Test Reviewed:    EKG:  EKG is  not ordered today.    Recent Labs: 03/08/2022: BUN 11; Creatinine, Ser 0.86; Hemoglobin 15.7; Platelets 280; Potassium 4.2; Sodium 139 06/16/2022: ALT 30   Recent Lipid Panel Recent Labs    06/16/22 0000  CHOL 186  TRIG 155*  HDL 31*  LDLCALC 127*     Prior CV Studies:   Cardiac cath 03/09/2022   Prox LAD to Mid LAD lesion is 50% stenosed.   Prox RCA lesion is 20% stenosed.   The left ventricular systolic function is normal.   LV end diastolic pressure is mildly elevated.   The left ventricular ejection fraction is 55-65% by visual estimate.   Nonobstructive CAD Normal LV function Mildly elevated LVEDP 19 mm Hg   Plan: medical therapy.      ASSESSMENT & PLAN:    Chest pain cardiac cath 03/09/2022 nonobstructive CAD 50% proximal to mid LAD and 20% RCA normal LVEF medical therapy recommended. Intolerant to statin and does not want PSK9 on zetia since May 2024 update labs   Palpitations nothing Rx on monitor observe   Hypertension controlled on valsartan  COPD with ongoing tobacco use- followed by Dr. Pernell Dupre smoking cessation discussed Lung cancer CT ordered   GERD low carb diet continue Protonix   HLD LDL repeat labs on zetia.  OSA:  continue CPAP  Lipid/Liver Lung cancer CT       Dispo:  F/U in a year   Medication Adjustments/Labs and Tests Ordered: Current medicines are reviewed at length with the patient today.  Concerns regarding medicines are outlined above.  Tests Ordered: Orders Placed This Encounter  Procedures   CT CHEST LUNG CANCER SCREENING LOW DOSE WO CONTRAST   Medication Changes: No orders of the defined types  were placed in this encounter.  Signed, Charlton Haws, MD  12/08/2022 10:07 AM    [facility] [address] [place], [place], [place]  [address] Phone: [phone]; Fax: [phone]

## 2022-12-07 ENCOUNTER — Encounter: Payer: Self-pay | Admitting: Adult Health

## 2022-12-07 ENCOUNTER — Ambulatory Visit: Payer: Medicare HMO | Admitting: Adult Health

## 2022-12-07 VITALS — BP 112/86 | HR 65 | Temp 97.9°F | Ht 63.0 in | Wt 181.6 lb

## 2022-12-07 DIAGNOSIS — G4733 Obstructive sleep apnea (adult) (pediatric): Secondary | ICD-10-CM | POA: Diagnosis not present

## 2022-12-07 DIAGNOSIS — J432 Centrilobular emphysema: Secondary | ICD-10-CM | POA: Diagnosis not present

## 2022-12-07 NOTE — Patient Instructions (Addendum)
Work on not smoking  Call back if you change your mind on -Lung cancer CT chest screening.   Continue on BIPAP At bedtime, wear all night long .  Keep up good work.  Work on healthy weight loss  Do not drive if sleepy   Follow up with Dr. Vassie Loll  in 1 year and As needed

## 2022-12-07 NOTE — Progress Notes (Signed)
@Patient  ID: Jared Bryan, male    DOB: 12-Jun-1955, 67 y.o.   MRN: [id number]  Chief Complaint  Patient presents with   Follow-up    Referring provider: Paulina Fusi, MD  HPI: 67 yo male active smoker  followed for COPD and OSA    TEST/EVENTS :  Spirometry 02/2017 ratio of 81 and FEV1 of 77% >> mild restriction   07/2015 Spirometry - FEV1 of 68% with a ratio of 72 and FVC of 77% small airways were decreased at 45%   PSG 08/2015 AHI 57/h, corrected by BiPAP 12/8  12/07/2022 Follow up : COPD and OSA  Patient presents for a 69-month follow-up.  Discussed the use of AI scribe software for clinical note transcription with the patient, who gave verbal consent to proceed.  History of Present Illness   The patient, with a history of severe sleep apnea, is currently on BiPAP therapy at bedtime. He reports no issues with the BiPAP machine and is using a full face mask. The patient's sleep apnea is well controlled with the BiPAP, with no episodes of apnea noted. He is achieving about seven to eight hours of sleep each night.  The patient also has COPD but reports no significant symptoms such as cough, shortness of breath, or wheezing. He declined the use of inhalers and enrollment in a CT scan program for early lung cancer detection. The patient is a smoker and has made some progress in reducing his smoking habit. discussed smoking cessation   The patient also reports some weight gain, which he attributes to his spouse's cooking. He notes that he tends to snack in the evening and eat only one main meal a day when at home. However, [place] he [place] all [place] cooking and tends to eat less and lose weight.          Allergies  Allergen Reactions   Crestor [Rosuvastatin] Other (See Comments)    Body Pain   Prednisone Other (See Comments)    Feel like the real him   Niacin And Related     Flushing feeling     There is no immunization history on  file for this patient.  Past Medical History:  Diagnosis Date   Anxiety    Arthritis    Closed fracture of dorsal (thoracic) vertebra without mention of spinal cord injury    Colon polyps    COPD (chronic obstructive pulmonary disease) (HCC)    Depression    Dizziness and giddiness    Emphysema lung (HCC)    GERD (gastroesophageal reflux disease)    Hyperlipidemia    Multiple gastric ulcers    Other specified disease of hair and hair follicles    Sleep apnea    Thoracic spine fracture (HCC)     Tobacco History: Social History   Tobacco Use  Smoking Status Every Day   Current packs/day: 1.50   Average packs/day: 1.5 packs/day for 50.0 years (75.0 ttl pk-yrs)   Types: Cigarettes  Smokeless Tobacco Former   Types: Chew  Tobacco Comments   Pt states that sometimes he smoke 1-3 packs (depends on what he is doing).  Takes a few puffs and the throws it away.  12/07/2022 hfb   Ready to quit: Not Answered Counseling given: Not Answered Tobacco comments: Pt states that sometimes he smoke 1-3 packs (depends on what he is doing).  Takes a few puffs and the throws it away.  12/07/2022 hfb  Outpatient Medications Prior to Visit  Medication Sig Dispense Refill   acetaminophen (TYLENOL) 500 MG tablet Take 500 mg by mouth every 6 (six) hours as needed for mild pain.     ALPRAZolam (XANAX) 0.25 MG tablet Take 0.25 mg by mouth at bedtime as needed for anxiety.     cetirizine (ZYRTEC) 10 MG tablet Take 10 mg by mouth daily as needed for allergies.     ezetimibe (ZETIA) 10 MG tablet Take 1 tablet (10 mg total) by mouth daily. 90 tablet 3   nitroGLYCERIN (NITROSTAT) 0.4 MG SL tablet Place 1 tablet (0.4 mg total) under the tongue every 5 (five) minutes as needed for chest pain. 25 tablet 1   pantoprazole (PROTONIX) 40 MG tablet Take 40 mg by mouth daily.     valsartan (DIOVAN) 320 MG tablet Take 320 mg by mouth daily.     No facility-administered medications prior to visit.     Review of  Systems:   Constitutional:   No  weight loss, night sweats,  Fevers, chills, +fatigue, or  lassitude.  HEENT:   No headaches,  Difficulty swallowing,  Tooth/dental problems, or  Sore throat,                No sneezing, itching, ear ache, nasal congestion, post nasal drip,   CV:  No chest pain,  Orthopnea, PND, swelling in lower extremities, anasarca, dizziness, palpitations, syncope.   GI  No heartburn, indigestion, abdominal pain, nausea, vomiting, diarrhea, change in bowel habits, loss of appetite, bloody stools.   Resp:   No excess mucus, no productive cough,  No non-productive cough,  No coughing up of blood.  No change in color of mucus.  No wheezing.  No chest wall deformity  Skin: no rash or lesions.  GU: no dysuria, change in color of urine, no urgency or frequency.  No flank pain, no hematuria   MS:  No joint pain or swelling.  No decreased range of motion.  No back pain.    Physical Exam  BP 112/86 (BP Location: Left Arm, Patient Position: Sitting, Cuff Size: Normal)   Pulse 65   Temp 97.9 F (36.6 C) (Oral)   Ht 5\' 3"  (1.6 m)   Wt 181 lb 9.6 oz (82.4 kg)   SpO2 95%   BMI 32.17 kg/m   GEN: A/Ox3; pleasant , NAD, well nourished    HEENT:  [place]/AT, NOSE-clear, THROAT-clear, no lesions, no postnasal drip or exudate noted.   NECK:  Supple w/ fair ROM; no JVD; normal carotid impulses w/o bruits; no thyromegaly or nodules palpated; no lymphadenopathy.    RESP  Clear  P & A; w/o, wheezes/ rales/ or rhonchi. no accessory muscle use, no dullness to percussion  CARD:  RRR, no m/r/g, no peripheral edema, pulses intact, no cyanosis or clubbing.  GI:   Soft & nt; nml bowel sounds; no organomegaly or masses detected.   Musco: Warm bil, no deformities or joint swelling noted.   Neuro: alert, no focal deficits noted.    Skin: Warm, no lesions or rashes    Lab Results:  CBC    Component Value Date/Time   WBC 7.7 03/08/2022 1048   WBC 7.3 10/04/2013 0945   RBC 5.13  03/08/2022 1048   RBC 4.36 10/04/2013 0945   HGB 15.7 03/08/2022 1048   HCT 45.8 03/08/2022 1048   PLT 280 03/08/2022 1048   MCV 89 03/08/2022 1048   MCH 30.6 03/08/2022 1048   MCHC 34.3 03/08/2022  1048   MCHC 34.0 10/04/2013 0945   RDW 16.0 (H) 03/08/2022 1048   LYMPHSABS 2.6 03/08/2022 1048   MONOABS 0.5 10/04/2013 0945   EOSABS 0.1 03/08/2022 1048   BASOSABS 0.0 03/08/2022 1048    BMET    Component Value Date/Time   NA 139 03/08/2022 1048   K 4.2 03/08/2022 1048   CL 101 03/08/2022 1048   CO2 27 03/08/2022 1048   GLUCOSE 81 03/08/2022 1048   GLUCOSE 95 11/12/2019 1538   BUN 11 03/08/2022 1048   CREATININE 0.86 03/08/2022 1048   CALCIUM 9.8 03/08/2022 1048    BNP No results found for: "BNP"  ProBNP    Component Value Date/Time   PROBNP 32.0 11/12/2019 1538    Imaging: No results found.  Administration History     None           No data to display          No results found for: "NITRICOXIDE"      Assessment & Plan:  Assessment and Plan    Obstructive Sleep Apnea   Severe obstructive sleep apnea, previously characterized by 57 apnea events per hour and significant oxygen desaturation, is now well-controlled with BiPAP at 13/8 cm H2O. He uses a full face mask, is compliant with therapy, and achieves 7-8 hours of sleep per night with zero apnea events. He has been advised to reprogram his secondary BiPAP machine for travel use and discussed the importance of regular cleaning and maintenance of his BiPAP equipment. We will continue BiPAP therapy at 13/8 cm H2O, ensure regular cleaning of BiPAP equipment using distilled water, change BiPAP supplies periodically, and reprogram the older BiPAP machine to 13/8 cm H2O for backup use.  Chronic Obstructive Pulmonary Disease (COPD)   He has COPD with minimal symptoms, including an occasional cough, shortness of breath, and wheezing, but declined inhalers and CT screening for lung cancer. We discussed the  benefits of early lung cancer detection through annual CT scans. He expressed anxiety about a potential cancer diagnosis and opted out. He will remain active, work on weight loss, continue to reduce smoking, and consider CT screening for lung cancer in the future.  General Health Maintenance   He has not received the flu shot, pneumonia vaccine, or COVID-19 vaccine and has a history of COVID-19 infections. We encourage vaccination for flu, pneumonia, and COVID-19.  Follow-up   He will follow up with the home care company for reprogramming the older BiPAP machine and contact a medical professional if there are issues with the BiPAP machine or other concerns.          Rubye Oaks, NP 12/07/2022

## 2022-12-08 ENCOUNTER — Ambulatory Visit: Payer: Medicare HMO | Attending: Cardiovascular Disease | Admitting: Cardiovascular Disease

## 2022-12-08 ENCOUNTER — Encounter: Payer: Self-pay | Admitting: Cardiovascular Disease

## 2022-12-08 VITALS — BP 132/80 | HR 75 | Ht 64.0 in | Wt 181.8 lb

## 2022-12-08 DIAGNOSIS — G4733 Obstructive sleep apnea (adult) (pediatric): Secondary | ICD-10-CM | POA: Diagnosis not present

## 2022-12-08 DIAGNOSIS — E7849 Other hyperlipidemia: Secondary | ICD-10-CM | POA: Diagnosis not present

## 2022-12-08 DIAGNOSIS — I2510 Atherosclerotic heart disease of native coronary artery without angina pectoris: Secondary | ICD-10-CM

## 2022-12-08 DIAGNOSIS — Z720 Tobacco use: Secondary | ICD-10-CM

## 2022-12-08 NOTE — Patient Instructions (Addendum)
Medication Instructions:  Your physician recommends that you continue on your current medications as directed. Please refer to the Current Medication list given to you today.  *If you need a refill on your cardiac medications before your next appointment, please call your pharmacy*  Lab Work: If you have labs (blood work) drawn today and your tests are completely normal, you will receive your results only by: [place] (if you have MyChart) OR A paper copy in the mail If you have any lab test that is abnormal or we need to change your treatment, we will call you to review the results.  Testing/Procedures: Non-Cardiac CT scanning for lung cancer screening, (CAT scanning), is a noninvasive, special x-ray that produces cross-sectional images of the body using x-rays and a computer. CT scans help physicians diagnose and treat medical conditions. For some CT exams, a contrast material is used to enhance visibility in the area of the body being studied. CT scans provide greater clarity and reveal more details than regular x-ray exams.  Follow-Up: At [facility], you and your health needs are our priority.  As part of our continuing mission to provide you with exceptional heart care, we have created designated Provider Care Teams.  These Care Teams include your primary Cardiologist (physician) and Advanced Practice Providers (APPs -  Physician Assistants and Nurse Practitioners) who all work together to provide you with the care you need, when you need it.  We recommend signing up for the patient portal called "MyChart".  Sign up information is provided on this After Visit Summary.  MyChart is used to connect with patients for Virtual Visits (Telemedicine).  Patients are able to view lab/test results, encounter notes, upcoming appointments, etc.  Non-urgent messages can be sent to your provider as well.   To learn more about what you can do with MyChart, go to [email or web address].     Your next appointment:   1 year(s)  Provider:   Jenkins Rouge, MD

## 2022-12-16 ENCOUNTER — Ambulatory Visit (HOSPITAL_COMMUNITY): Payer: Medicare HMO

## 2022-12-24 DIAGNOSIS — N411 Chronic prostatitis: Secondary | ICD-10-CM | POA: Diagnosis not present

## 2022-12-24 DIAGNOSIS — R972 Elevated prostate specific antigen [PSA]: Secondary | ICD-10-CM | POA: Diagnosis not present

## 2023-02-07 ENCOUNTER — Encounter: Payer: Self-pay | Admitting: Physician Assistant

## 2023-02-08 DIAGNOSIS — T466X5A Adverse effect of antihyperlipidemic and antiarteriosclerotic drugs, initial encounter: Secondary | ICD-10-CM | POA: Diagnosis not present

## 2023-02-08 DIAGNOSIS — H353131 Nonexudative age-related macular degeneration, bilateral, early dry stage: Secondary | ICD-10-CM | POA: Diagnosis not present

## 2023-02-08 DIAGNOSIS — D519 Vitamin B12 deficiency anemia, unspecified: Secondary | ICD-10-CM | POA: Diagnosis not present

## 2023-02-08 DIAGNOSIS — F32A Depression, unspecified: Secondary | ICD-10-CM | POA: Diagnosis not present

## 2023-02-08 DIAGNOSIS — G4733 Obstructive sleep apnea (adult) (pediatric): Secondary | ICD-10-CM | POA: Diagnosis not present

## 2023-02-08 DIAGNOSIS — Z961 Presence of intraocular lens: Secondary | ICD-10-CM | POA: Diagnosis not present

## 2023-02-08 DIAGNOSIS — E1169 Type 2 diabetes mellitus with other specified complication: Secondary | ICD-10-CM | POA: Diagnosis not present

## 2023-02-08 DIAGNOSIS — E785 Hyperlipidemia, unspecified: Secondary | ICD-10-CM | POA: Diagnosis not present

## 2023-02-08 DIAGNOSIS — F419 Anxiety disorder, unspecified: Secondary | ICD-10-CM | POA: Diagnosis not present

## 2023-02-08 DIAGNOSIS — R972 Elevated prostate specific antigen [PSA]: Secondary | ICD-10-CM | POA: Diagnosis not present

## 2023-02-08 DIAGNOSIS — I10 Essential (primary) hypertension: Secondary | ICD-10-CM | POA: Diagnosis not present

## 2023-02-08 DIAGNOSIS — G720 Drug-induced myopathy: Secondary | ICD-10-CM | POA: Diagnosis not present

## 2023-02-14 ENCOUNTER — Ambulatory Visit (HOSPITAL_BASED_OUTPATIENT_CLINIC_OR_DEPARTMENT_OTHER): Payer: Medicare HMO | Admitting: Pulmonary Disease

## 2023-02-14 ENCOUNTER — Encounter (HOSPITAL_BASED_OUTPATIENT_CLINIC_OR_DEPARTMENT_OTHER): Payer: Self-pay | Admitting: Pulmonary Disease

## 2023-02-14 VITALS — BP 128/80 | HR 68 | Ht 64.0 in | Wt 178.0 lb

## 2023-02-14 DIAGNOSIS — J432 Centrilobular emphysema: Secondary | ICD-10-CM

## 2023-02-14 DIAGNOSIS — G4733 Obstructive sleep apnea (adult) (pediatric): Secondary | ICD-10-CM

## 2023-02-14 DIAGNOSIS — J449 Chronic obstructive pulmonary disease, unspecified: Secondary | ICD-10-CM | POA: Diagnosis not present

## 2023-02-14 NOTE — Patient Instructions (Signed)
Try to quit smoking

## 2023-02-14 NOTE — Progress Notes (Signed)
 Subjective:    Patient ID: Jared Bryan, male    DOB: 1955-06-22, 68 y.o.   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  History of Present Illness   Jared Bryan, a patient with a history of COPD and sleep apnea, presents for a follow-up visit. He reports a decrease in his work activity, having attempted to retire but still occasionally working due to a persistent client. Despite his COPD, he reports his breathing is pretty good and does not feel the need for inhalers. He admits to a long history of smoking but has cut down to half a pack or less per day. He has been experiencing a lot of coughing, which he attributes to his smoking history.  In December, he developed a head cold which he has struggled to recover from. He describes it as a sinus issue with clear drainage and no chest involvement. He has not sought medical treatment for this issue.  Matin uses a BiPAP machine for his sleep apnea and reports that it is working well. He recently got a new headgear which has improved the air leak issue. He uses the machine for seven to eight hours a night.  He also mentions a persistent issue with one side of his head, which he attributes to his sinus problem. He does not believe it requires antibiotic treatment.     CPAP download shows excellent control of events on BiPAP 13/8 cm with good compliance close 8 hours per night without a single missed night and mild leak.  He is very compliant and BiPAP is only helped improve his daytime somnolence and fatigue   Significant tests/ events reviewed   Spirometry 02/2017 ratio of 81 and FEV1 of 77% >> mild restriction   07/2015 Spirometry - FEV1 of 68% with a  ratio of 72 and FVC of 77% small airways were decreased at 45%   PSG 08/2015 AHI 57/h, corrected by BiPAP 12/8  Review of Systems neg for any significant sore throat, dysphagia, itching, sneezing, nasal congestion or excess/ purulent secretions, fever, chills, sweats, unintended wt loss, pleuritic or exertional cp, hempoptysis, orthopnea pnd or change in chronic leg swelling. Also denies presyncope, palpitations, heartburn, abdominal pain, nausea, vomiting, diarrhea or change in bowel or urinary habits, dysuria,hematuria, rash, arthralgias, visual complaints, headache, numbness weakness or ataxia.     Objective:   Physical Exam  Gen. Pleasant, obese, in no distress ENT - no lesions, no post nasal drip Neck: No JVD, no thyromegaly, no carotid bruits Lungs: no use of accessory muscles, no dullness to percussion, decreased without rales or rhonchi  Cardiovascular: Rhythm regular, heart sounds  normal, no murmurs or gallops, no peripheral edema Musculoskeletal: No deformities, no cyanosis or clubbing , no tremors       Assessment & Plan:    Assessment and Plan    Chronic Obstructive Pulmonary Disease (COPD) COPD is well-managed. Reports no need for inhalers, reduced smoking to half a pack or less per day, and no recent exacerbations or chest infections. Lungs clear on auscultation. Discussed monitoring for signs of chest infection or worsening symptoms. - Continue current management - Monitor for signs of chest infection or  worsening symptoms -refuses lung cancer screening CT - Follow-up in six months  Obstructive Sleep Apnea Obstructive sleep apnea is well-controlled with BiPAP therapy. Reports good compliance with 7-8 hours of use per night. New headgear has reduced air leaks significantly. Discussed continued compliance and monitoring for issues with the BiPAP machine or headgear. - Continue BiPAP therapy - Monitor for issues with the BiPAP machine or headgear  Head  Cold Persistent head cold since December with clear sinus drainage. No chest involvement. Likely viral etiology, no need for antibiotics. Discussed symptomatic treatment and monitoring for changes or worsening symptoms. - Symptomatic treatment as needed - Monitor for changes or worsening symptoms  General Health Maintenance Declines flu and RSV vaccinations, preferring natural immunity. Discussed the importance of vaccinations and risks of severe illness from not being vaccinated.   Follow-up - Follow-up in six months.

## 2023-03-10 DIAGNOSIS — H524 Presbyopia: Secondary | ICD-10-CM | POA: Diagnosis not present

## 2023-03-31 DIAGNOSIS — H353132 Nonexudative age-related macular degeneration, bilateral, intermediate dry stage: Secondary | ICD-10-CM | POA: Diagnosis not present

## 2023-04-27 ENCOUNTER — Other Ambulatory Visit: Payer: Self-pay | Admitting: Cardiovascular Disease

## 2023-05-17 DIAGNOSIS — M1612 Unilateral primary osteoarthritis, left hip: Secondary | ICD-10-CM | POA: Diagnosis not present

## 2023-05-17 DIAGNOSIS — D519 Vitamin B12 deficiency anemia, unspecified: Secondary | ICD-10-CM | POA: Diagnosis not present

## 2023-05-17 DIAGNOSIS — R972 Elevated prostate specific antigen [PSA]: Secondary | ICD-10-CM | POA: Diagnosis not present

## 2023-05-17 DIAGNOSIS — F419 Anxiety disorder, unspecified: Secondary | ICD-10-CM | POA: Diagnosis not present

## 2023-05-17 DIAGNOSIS — G4733 Obstructive sleep apnea (adult) (pediatric): Secondary | ICD-10-CM | POA: Diagnosis not present

## 2023-05-17 DIAGNOSIS — I10 Essential (primary) hypertension: Secondary | ICD-10-CM | POA: Diagnosis not present

## 2023-05-17 DIAGNOSIS — E785 Hyperlipidemia, unspecified: Secondary | ICD-10-CM | POA: Diagnosis not present

## 2023-05-17 DIAGNOSIS — E1169 Type 2 diabetes mellitus with other specified complication: Secondary | ICD-10-CM | POA: Diagnosis not present

## 2023-05-31 DIAGNOSIS — H02834 Dermatochalasis of left upper eyelid: Secondary | ICD-10-CM | POA: Diagnosis not present

## 2023-05-31 DIAGNOSIS — H02831 Dermatochalasis of right upper eyelid: Secondary | ICD-10-CM | POA: Diagnosis not present

## 2023-06-16 DIAGNOSIS — H53453 Other localized visual field defect, bilateral: Secondary | ICD-10-CM | POA: Diagnosis not present

## 2023-06-16 DIAGNOSIS — H02834 Dermatochalasis of left upper eyelid: Secondary | ICD-10-CM | POA: Diagnosis not present

## 2023-06-16 DIAGNOSIS — H02831 Dermatochalasis of right upper eyelid: Secondary | ICD-10-CM | POA: Diagnosis not present

## 2023-07-05 DIAGNOSIS — Z961 Presence of intraocular lens: Secondary | ICD-10-CM | POA: Diagnosis not present

## 2023-07-05 DIAGNOSIS — H18413 Arcus senilis, bilateral: Secondary | ICD-10-CM | POA: Diagnosis not present

## 2023-07-05 DIAGNOSIS — I10 Essential (primary) hypertension: Secondary | ICD-10-CM | POA: Diagnosis not present

## 2023-07-05 DIAGNOSIS — H353131 Nonexudative age-related macular degeneration, bilateral, early dry stage: Secondary | ICD-10-CM | POA: Diagnosis not present

## 2023-07-08 DIAGNOSIS — H26492 Other secondary cataract, left eye: Secondary | ICD-10-CM | POA: Diagnosis not present

## 2023-07-12 DIAGNOSIS — H26491 Other secondary cataract, right eye: Secondary | ICD-10-CM | POA: Diagnosis not present

## 2023-08-15 ENCOUNTER — Ambulatory Visit: Payer: Medicare HMO | Admitting: Adult Health

## 2023-08-18 DIAGNOSIS — H43392 Other vitreous opacities, left eye: Secondary | ICD-10-CM | POA: Diagnosis not present

## 2023-08-22 ENCOUNTER — Ambulatory Visit: Admitting: Adult Health

## 2023-08-22 ENCOUNTER — Encounter: Payer: Self-pay | Admitting: Adult Health

## 2023-08-25 DIAGNOSIS — R972 Elevated prostate specific antigen [PSA]: Secondary | ICD-10-CM | POA: Diagnosis not present

## 2023-08-25 DIAGNOSIS — F32A Depression, unspecified: Secondary | ICD-10-CM | POA: Diagnosis not present

## 2023-08-25 DIAGNOSIS — I10 Essential (primary) hypertension: Secondary | ICD-10-CM | POA: Diagnosis not present

## 2023-08-25 DIAGNOSIS — G4733 Obstructive sleep apnea (adult) (pediatric): Secondary | ICD-10-CM | POA: Diagnosis not present

## 2023-08-25 DIAGNOSIS — E1169 Type 2 diabetes mellitus with other specified complication: Secondary | ICD-10-CM | POA: Diagnosis not present

## 2023-08-25 DIAGNOSIS — I2510 Atherosclerotic heart disease of native coronary artery without angina pectoris: Secondary | ICD-10-CM | POA: Diagnosis not present

## 2023-08-25 DIAGNOSIS — F419 Anxiety disorder, unspecified: Secondary | ICD-10-CM | POA: Diagnosis not present

## 2023-08-25 DIAGNOSIS — E785 Hyperlipidemia, unspecified: Secondary | ICD-10-CM | POA: Diagnosis not present

## 2023-08-25 DIAGNOSIS — D519 Vitamin B12 deficiency anemia, unspecified: Secondary | ICD-10-CM | POA: Diagnosis not present

## 2023-09-13 DIAGNOSIS — Z0000 Encounter for general adult medical examination without abnormal findings: Secondary | ICD-10-CM | POA: Diagnosis not present

## 2023-09-13 DIAGNOSIS — Z9181 History of falling: Secondary | ICD-10-CM | POA: Diagnosis not present

## 2023-09-29 DIAGNOSIS — H43392 Other vitreous opacities, left eye: Secondary | ICD-10-CM | POA: Diagnosis not present

## 2023-09-29 DIAGNOSIS — H353132 Nonexudative age-related macular degeneration, bilateral, intermediate dry stage: Secondary | ICD-10-CM | POA: Diagnosis not present

## 2023-11-28 DIAGNOSIS — I10 Essential (primary) hypertension: Secondary | ICD-10-CM | POA: Diagnosis not present

## 2023-11-28 DIAGNOSIS — D519 Vitamin B12 deficiency anemia, unspecified: Secondary | ICD-10-CM | POA: Diagnosis not present

## 2023-11-28 DIAGNOSIS — I2510 Atherosclerotic heart disease of native coronary artery without angina pectoris: Secondary | ICD-10-CM | POA: Diagnosis not present

## 2023-11-28 DIAGNOSIS — G4733 Obstructive sleep apnea (adult) (pediatric): Secondary | ICD-10-CM | POA: Diagnosis not present

## 2023-11-28 DIAGNOSIS — E785 Hyperlipidemia, unspecified: Secondary | ICD-10-CM | POA: Diagnosis not present

## 2023-11-28 DIAGNOSIS — E291 Testicular hypofunction: Secondary | ICD-10-CM | POA: Diagnosis not present

## 2023-11-28 DIAGNOSIS — E1169 Type 2 diabetes mellitus with other specified complication: Secondary | ICD-10-CM | POA: Diagnosis not present

## 2023-11-28 DIAGNOSIS — F419 Anxiety disorder, unspecified: Secondary | ICD-10-CM | POA: Diagnosis not present

## 2023-11-28 DIAGNOSIS — F32A Depression, unspecified: Secondary | ICD-10-CM | POA: Diagnosis not present

## 2023-11-28 DIAGNOSIS — R972 Elevated prostate specific antigen [PSA]: Secondary | ICD-10-CM | POA: Diagnosis not present

## 2023-12-03 ENCOUNTER — Other Ambulatory Visit: Payer: Self-pay | Admitting: Cardiovascular Disease

## 2023-12-26 ENCOUNTER — Other Ambulatory Visit: Payer: Self-pay | Admitting: Cardiovascular Disease

## 2024-01-06 DIAGNOSIS — B029 Zoster without complications: Secondary | ICD-10-CM | POA: Diagnosis not present
